# Patient Record
Sex: Female | Born: 1980 | Race: White | Hispanic: No | Marital: Married | Smoking: Never smoker
Health system: Southern US, Community
[De-identification: ages and names within clinical notes are randomized; demographics above are authoritative.]

## PROBLEM LIST (undated history)

## (undated) DIAGNOSIS — R011 Cardiac murmur, unspecified: Secondary | ICD-10-CM

## (undated) DIAGNOSIS — Z789 Other specified health status: Secondary | ICD-10-CM

## (undated) DIAGNOSIS — D649 Anemia, unspecified: Secondary | ICD-10-CM

## (undated) HISTORY — DX: Anemia, unspecified: D64.9

## (undated) HISTORY — PX: NO PAST SURGERIES: SHX2092

---

## 2010-09-20 LAB — GC/CHLAMYDIA PROBE AMP, GENITAL
Chlamydia: NEGATIVE
Gonorrhea: NEGATIVE

## 2010-12-22 ENCOUNTER — Other Ambulatory Visit (HOSPITAL_COMMUNITY): Payer: Self-pay | Admitting: Obstetrics and Gynecology

## 2010-12-22 DIAGNOSIS — O2690 Pregnancy related conditions, unspecified, unspecified trimester: Secondary | ICD-10-CM

## 2010-12-30 ENCOUNTER — Ambulatory Visit (HOSPITAL_COMMUNITY): Payer: Self-pay

## 2011-01-01 ENCOUNTER — Other Ambulatory Visit (HOSPITAL_COMMUNITY): Payer: Self-pay | Admitting: Obstetrics and Gynecology

## 2011-01-01 DIAGNOSIS — O2690 Pregnancy related conditions, unspecified, unspecified trimester: Secondary | ICD-10-CM

## 2011-01-01 DIAGNOSIS — Z3689 Encounter for other specified antenatal screening: Secondary | ICD-10-CM

## 2011-01-06 ENCOUNTER — Other Ambulatory Visit (HOSPITAL_COMMUNITY): Payer: Self-pay

## 2011-01-08 ENCOUNTER — Ambulatory Visit (HOSPITAL_COMMUNITY)
Admission: RE | Admit: 2011-01-08 | Discharge: 2011-01-08 | Disposition: A | Discharge status: Home / Self Care | Payer: Managed Care, Other (non HMO) | Source: Ambulatory Visit | Attending: Obstetrics and Gynecology | Admitting: Obstetrics and Gynecology

## 2011-01-08 DIAGNOSIS — Z3689 Encounter for other specified antenatal screening: Secondary | ICD-10-CM

## 2011-01-08 DIAGNOSIS — Z363 Encounter for antenatal screening for malformations: Secondary | ICD-10-CM | POA: Insufficient documentation

## 2011-01-08 DIAGNOSIS — O2690 Pregnancy related conditions, unspecified, unspecified trimester: Secondary | ICD-10-CM

## 2011-01-08 DIAGNOSIS — Z1389 Encounter for screening for other disorder: Secondary | ICD-10-CM | POA: Insufficient documentation

## 2011-01-08 DIAGNOSIS — O358XX0 Maternal care for other (suspected) fetal abnormality and damage, not applicable or unspecified: Secondary | ICD-10-CM | POA: Insufficient documentation

## 2011-02-24 ENCOUNTER — Inpatient Hospital Stay (HOSPITAL_COMMUNITY): Payer: Managed Care, Other (non HMO)

## 2011-02-24 ENCOUNTER — Encounter (HOSPITAL_COMMUNITY): Payer: Self-pay | Admitting: *Deleted

## 2011-02-24 ENCOUNTER — Inpatient Hospital Stay (HOSPITAL_COMMUNITY)
Admission: AD | Admit: 2011-02-24 | Discharge: 2011-03-08 | DRG: 765 | Disposition: A | Discharge status: Home / Self Care | Payer: Managed Care, Other (non HMO) | Source: Ambulatory Visit | Attending: Obstetrics and Gynecology | Admitting: Obstetrics and Gynecology

## 2011-02-24 DIAGNOSIS — O2690 Pregnancy related conditions, unspecified, unspecified trimester: Secondary | ICD-10-CM

## 2011-02-24 DIAGNOSIS — O4290 Premature rupture of membranes, unspecified as to length of time between rupture and onset of labor, unspecified weeks of gestation: Secondary | ICD-10-CM | POA: Diagnosis present

## 2011-02-24 DIAGNOSIS — O321XX0 Maternal care for breech presentation, not applicable or unspecified: Principal | ICD-10-CM | POA: Diagnosis present

## 2011-02-24 DIAGNOSIS — D649 Anemia, unspecified: Secondary | ICD-10-CM | POA: Diagnosis not present

## 2011-02-24 DIAGNOSIS — O283 Abnormal ultrasonic finding on antenatal screening of mother: Secondary | ICD-10-CM | POA: Diagnosis present

## 2011-02-24 DIAGNOSIS — N856 Intrauterine synechiae: Secondary | ICD-10-CM

## 2011-02-24 DIAGNOSIS — O26859 Spotting complicating pregnancy, unspecified trimester: Secondary | ICD-10-CM | POA: Diagnosis present

## 2011-02-24 DIAGNOSIS — O9903 Anemia complicating the puerperium: Secondary | ICD-10-CM | POA: Diagnosis not present

## 2011-02-24 HISTORY — DX: Other specified health status: Z78.9

## 2011-02-24 HISTORY — DX: Cardiac murmur, unspecified: R01.1

## 2011-02-24 LAB — CBC
Hemoglobin: 11.7 g/dL — ABNORMAL LOW (ref 12.0–15.0)
MCH: 33.6 pg (ref 26.0–34.0)
MCV: 98.6 fL (ref 78.0–100.0)
RBC: 3.48 MIL/uL — ABNORMAL LOW (ref 3.87–5.11)

## 2011-02-24 LAB — WET PREP, GENITAL
Clue Cells Wet Prep HPF POC: NONE SEEN
Trich, Wet Prep: NONE SEEN
WBC, Wet Prep HPF POC: NONE SEEN
Yeast Wet Prep HPF POC: NONE SEEN

## 2011-02-24 LAB — URINALYSIS, ROUTINE W REFLEX MICROSCOPIC
Bilirubin Urine: NEGATIVE
Glucose, UA: NEGATIVE mg/dL
Ketones, ur: NEGATIVE mg/dL
Leukocytes, UA: NEGATIVE
pH: 7.5 (ref 5.0–8.0)

## 2011-02-24 LAB — STREP B DNA PROBE: GBS: NEGATIVE

## 2011-02-24 MED ORDER — BETAMETHASONE SOD PHOS & ACET 6 (3-3) MG/ML IJ SUSP
12.0000 mg | INTRAMUSCULAR | Status: AC
  Administered 2011-02-24 – 2011-02-25 (×2): 12 mg via INTRAMUSCULAR
  Filled 2011-02-24 (×2): qty 2

## 2011-02-24 MED ORDER — DOCUSATE SODIUM 100 MG PO CAPS
100.0000 mg | ORAL_CAPSULE | Freq: Every day | ORAL | Status: DC
  Administered 2011-02-24 – 2011-03-05 (×10): 100 mg via ORAL
  Filled 2011-02-24 (×10): qty 1

## 2011-02-24 MED ORDER — SODIUM CHLORIDE 0.9 % IV SOLN
2.0000 g | Freq: Four times a day (QID) | INTRAVENOUS | Status: AC
  Administered 2011-02-24 – 2011-02-26 (×8): 2 g via INTRAVENOUS
  Filled 2011-02-24 (×9): qty 2000

## 2011-02-24 MED ORDER — AZITHROMYCIN 250 MG PO TABS
1000.0000 mg | ORAL_TABLET | Freq: Once | ORAL | Status: AC
  Administered 2011-02-24: 1000 mg via ORAL
  Filled 2011-02-24: qty 4

## 2011-02-24 MED ORDER — AZITHROMYCIN 500 MG PO TABS
1000.0000 mg | ORAL_TABLET | Freq: Once | ORAL | Status: AC
  Administered 2011-02-28: 1000 mg via ORAL
  Filled 2011-02-24: qty 2

## 2011-02-24 MED ORDER — PRENATAL MULTIVITAMIN CH
1.0000 | ORAL_TABLET | Freq: Every day | ORAL | Status: DC
  Administered 2011-02-24 – 2011-03-05 (×11): 1 via ORAL
  Filled 2011-02-24 (×10): qty 1

## 2011-02-24 MED ORDER — CALCIUM CARBONATE ANTACID 500 MG PO CHEW
2.0000 | CHEWABLE_TABLET | ORAL | Status: DC | PRN
  Administered 2011-02-25 – 2011-03-02 (×5): 400 mg via ORAL
  Filled 2011-02-24 (×5): qty 2

## 2011-02-24 MED ORDER — ZOLPIDEM TARTRATE 10 MG PO TABS: 10.0000 mg | ORAL_TABLET | Freq: Every evening | ORAL | Status: DC | PRN

## 2011-02-24 MED ORDER — LACTATED RINGERS IV SOLN
INTRAVENOUS | Status: DC
  Administered 2011-02-24: 1000 mL via INTRAVENOUS
  Administered 2011-02-24 – 2011-02-26 (×6): via INTRAVENOUS

## 2011-02-24 MED ORDER — ACETAMINOPHEN 325 MG PO TABS: 650.0000 mg | ORAL_TABLET | ORAL | Status: DC | PRN

## 2011-02-24 MED ORDER — AMOXICILLIN 500 MG PO CAPS
500.0000 mg | ORAL_CAPSULE | Freq: Three times a day (TID) | ORAL | Status: AC
  Administered 2011-02-26 – 2011-03-03 (×15): 500 mg via ORAL
  Filled 2011-02-24 (×15): qty 1

## 2011-02-24 NOTE — ED Notes (Signed)
Gevena Barre CNM in to see pt and discuss plan of care.

## 2011-02-24 NOTE — Progress Notes (Signed)
Pt returned from MFM.  Dr Pennie Rushing in room talking in length about plan of care.  All questions answered and pt and family has a good understanding.

## 2011-02-24 NOTE — H&P (Signed)
Aimee Lynch is a 31 y.o. female presenting for PPROM at 0200. She is [redacted]w[redacted]d with EDC of 04/16/11.  Denies any ctx or cramping, has had a persistent L sided back ache that has persisted w pregnancy, improves with tylenol and rest. Denies back pain now. Denies any VB, +FM.  Pregnancy significant for: 1. 2nd trimester VB 2. Heart murmor 3. 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 Pt began car at 10wks, at approx 13wks, pt had episode of bleeding and was tx'd for BV.  Anatomy scan at 18wks had incomplete views of feet. A repeat scan was done at 21wks and states was normal per [place] report. Detailed prenatal visit notes are unavailable at the time of this note. Pt then had a NOB visit at [facility] at 24.6wks.  Pt had an MFM consult secondary to uterine synechiae and bilateral club feet noted, MFM [place] otherwise normal, at 26wks. At 28wks pt had a wet prep that was normal. Pt had a fetal echo at 30wks that was normal, secondary to to pt heart murmor.  .Maternal Medical History:  Reason for admission: Reason for admission: rupture of membranes.  Contractions: denies  Fetal activity: Perceived fetal activity is normal.    Prenatal complications: no prenatal complications   OB History    Grav Para Term Preterm Abortions TAB SAB Ect Mult Living   1         0     Past Medical History  Diagnosis Date  . No pertinent past medical history   . Heart murmur    Past Surgical History  Procedure Date  . No past surgeries    Family History: family history is not on file. Heart dx - MA  Social History:  reports that she has never smoked. She does not have any smokeless tobacco history on file. She reports that she does not drink alcohol or use illicit drugs. Pt is MWF 30yo, works full-time, and has a bachelor's degree.   Review of Systems  All other systems reviewed and are negative.    Dilation: 1 Effacement (%): Thick Exam by::  S. Kamori Barbier CNM Blood pressure 111/48, pulse 82, temperature 98.1 F (36.7 C), temperature source Oral, resp. rate 18, height 5\' 2"  (1.575 m), weight 86.183 kg (190 lb). Maternal Exam:  Abdomen: Patient reports no abdominal tenderness. Fundal height is aga.   Fetal presentation: vertex  Introitus: Normal vulva. Vagina is negative for discharge.  Ferning test: not done.  Nitrazine test: not done. Amniotic fluid character: clear. Large amt pooling clear fluid amnisure pos  Pelvis: adequate for delivery.   Cervix: Cervix evaluated by sterile speculum exam and digital exam.     Fetal Exam Fetal Monitor Review: Mode: ultrasound.   Baseline rate: 140.  Variability: moderate (6-25 bpm).   Pattern: accelerations present and no decelerations.    Fetal State Assessment: Category I - tracings are normal.     Physical Exam  Nursing note and vitals reviewed. Constitutional: She is oriented to person, place, and time. She appears well-developed and well-nourished.  HENT:  Head: Normocephalic.  Neck: Normal range of motion.  Cardiovascular: Normal rate and normal heart sounds.   Respiratory: Effort normal and breath sounds normal.  GI: Soft.  Genitourinary: Vagina normal. No vaginal discharge found.       lg amt pooling clear fluid cx ft/th/high/vtx  Musculoskeletal: Normal range of motion. She exhibits  no edema.  Neurological: She is alert and oriented to person, place, and time.  Skin: Skin is warm and dry.  Psychiatric: She has a normal mood and affect. Her behavior is normal.    Prenatal labs: ABO, Rh: A/Positive/-- (09/01 0000) Antibody: Negative (09/01 0000) Rubella: Immune (09/01 0000) RPR: Nonreactive (09/01 0000)  HBsAg: Negative (09/01 0000)  HIV: Non-reactive (09/01 0000)  GBS: Unknown (02/05 0000) done on admission on 02/24/11 GC/CT, pap, urine cx neg at NOB Varicella imm Quad neg 1hr gtt results unk at time of admission, but per pt were  "normal"   Assessment/Plan: IUP at [redacted]w[redacted]d  PPROM no evidence of labor  FHR reassuring  Admit to Antepartum IVF's LR IV ampicillin x48hr then PO amoxicillin/zithromax MFM consult with [place] to follow NICU consult BMZ course x2 doses q24 Wet prep  GC/CT GBS UA cx     Jeter Tomey M 02/24/2011, 8:58 AM

## 2011-02-24 NOTE — Consult Note (Signed)
MATERNAL FETAL MEDICINE CONSULT  Patient Name: Aimee Lynch Medical Record Number:  [id number] Date of Birth: [birth year] Requesting Physician Name:  Esmeralda Arthur, MD Date of Service: 02/24/2011  Chief Complaint PPROM  History of Present Illness Aimee Lynch was seen today for prenatal diagnosis secondary to PPROM at the request of Esmeralda Arthur, MD.  The patient is a 31 y.o. G1P0,at [redacted]w[redacted]d with an EDD of 04/16/2011.  She experienced a large gush of fluid this morning at 2am, followed by another large gush after arriving at the hospital for evaluation.  She denies any fever, chills, abdominal pain, contractions, or vaginal bleeding.  Her fetus is moving normally.  Her pregnancy has been complicated by a fetus with bilateral clubbed feet and uterine synechiae.  Review of Systems Pertinent items are noted in HPI.  Patient History OB History    Grav Para Term Preterm Abortions TAB SAB Ect Mult Living   1         0     # Outc Date GA Lbr Len/2nd Wgt Sex Del Anes PTL Lv   1 CUR               Past Medical History  Diagnosis Date  . No pertinent past medical history   . Heart murmur     Past Surgical History  Procedure Date  . No past surgeries     History   Social History  . Marital Status: Married    Spouse Name: N/A    Number of Children: N/A  . Years of Education: N/A   Social History Main Topics  . Smoking status: Never Smoker   . Smokeless tobacco: None  . Alcohol Use: No  . Drug Use: No  . Sexually Active: Yes   Other Topics Concern  . None   Social History Narrative  . None   In addition, the patient has no family history of mental retardation, birth defects, or genetic diseases.  Physical Examination Filed Vitals:   02/24/11 1112 02/24/11 1117 02/24/11 1122 02/24/11 1127  BP:      Pulse: 91 109 112 91  Temp:      TempSrc:      Resp:      Height:      Weight:      SpO2: 98% 97% 99% 99%   General appearance - alert, well appearing, and in no  distress Abdomen - soft, nontender, nondistended, no masses or organomegaly  Assessment and Recommendations 1.  PPROM.  The patient has clear evidence of PPROM on physical exam, but is showing no signs of labor, infection, or abruption at this time.  As such she should be managed in the standard fashion for PPROM and be delivered at 34 weeks, or earlier if signs of infection, labor, abruption, or fetal distress appear.  She is currently receiving betamethasone and PPROM latency antibiotics.  No modifications in management are needed at this time.  Presently her baby is in the frank breech presentation, so she will require a cesarean delivery unless the fetus spontaneous changes to a cephalic presentation prior to delivery.  Rema Fendt, MD

## 2011-02-24 NOTE — Progress Notes (Signed)
Rolled over in bed after voiding and lost a lot of watery discharge.  Stood up and it came running down her legs.  No cramping or bleeding.

## 2011-02-24 NOTE — Progress Notes (Signed)
UR Chart review completed.  

## 2011-02-24 NOTE — Progress Notes (Signed)
Clemmie Buelna is a 31 y.o. G1P0 at [redacted]w[redacted]d by LMP admitted for PROM  Subjective: GI: negative GU: neg OB: Good fetal movement        Objective: BP 105/53  Pulse 91  Temp(Src) 97.7 F (36.5 C) (Oral)  Resp 20  Ht 5\' 2"  (1.575 m)  Wt 190 lb (86.183 kg)  BMI 34.75 kg/m2  SpO2 99%      FHT:  FHR: 120-140 bpm, variability: moderate,  accelerations:  Present,  decelerations:  Present at admission, but resolved over first few hours, now no significant decelerations UC:   irreg and rare SVE:   Dilation: 1 Effacement (%): Thick Exam by:: S. Lillard CNM  Labs: Lab Results  Component Value Date   WBC 13.9* 02/24/2011   HGB 11.7* 02/24/2011   HCT 34.3* 02/24/2011   MCV 98.6 02/24/2011   PLT 272 02/24/2011    Assessment / Plan: GC/Cl on dirty catch urine Urine c&S Continue antibiotics Tocolysis until 24 hours after second dose of Bmethasone Magnesium for neuroprophylaxis if labor begins prior to 34 weeks MFM consult results pending  Fetal Wellbeing:  Category II   Aunika Kirsten P 02/24/2011, 12:41 PM

## 2011-02-24 NOTE — Consult Note (Signed)
Antenatal consult: pt inquiring into potential lactation difficulties with a preterm baby.  Questions about expressing milk after birth, issues with latch, etc. answered. Lurline Hare [place]

## 2011-02-24 NOTE — Progress Notes (Signed)
To MFM via w/c.

## 2011-02-25 LAB — URINE CULTURE

## 2011-02-25 LAB — GC/CHLAMYDIA PROBE AMP, URINE
Chlamydia, Swab/Urine, PCR: NEGATIVE
GC Probe Amp, Urine: NEGATIVE

## 2011-02-25 LAB — GC/CHLAMYDIA PROBE AMP, GENITAL: Chlamydia, DNA Probe: NEGATIVE

## 2011-02-25 MED ORDER — NIFEDIPINE 10 MG PO CAPS
10.0000 mg | ORAL_CAPSULE | ORAL | Status: DC | PRN
  Administered 2011-03-05: 10 mg via ORAL
  Filled 2011-02-25 (×2): qty 1

## 2011-02-25 NOTE — Progress Notes (Signed)
Patient ID: Aimee Lynch, female   DOB: [birth year], 31 y.o.   MRN: [id number] Pt without complaints.  No leakage of fluid or VB.  Good FM  BP 121/68  Pulse 99  Temp(Src) 98.8 F (37.1 C) (Oral)  Resp 18  Ht 5\' 2"  (1.575 m)  Wt 86.183 kg (190 lb)  BMI 34.75 kg/m2  SpO2 99%  FHTS Baseline: 120-125 bpm, Variability: Fair (1-6 bpm), Accelerations: Reactive and Decelerations: Absent  Toco irregular, every 2-20 minutes  Pt in NAD CV RRR Lungs CTAB abd  Gravid soft and NT GU no vb EXt no calf tenderness Results for orders placed during the hospital encounter of 02/24/11 (from the past 72 hour(s))  STREP B DNA PROBE     Status: Normal      Component Value Range Comment   Group B Strep Ag Unknown     AMNISURE RUPTURE OF MEMBRANE (ROM)     Status: Normal   Collection Time   02/24/11  3:44 AM      Component Value Range Comment   Amnisure ROM POSITIVE     GC/CHLAMYDIA PROBE AMP, GENITAL     Status: Normal   Collection Time   02/24/11  4:20 AM      Component Value Range Comment   GC Probe Amp, Genital NEGATIVE  NEGATIVE     Chlamydia, DNA Probe NEGATIVE  NEGATIVE    WET PREP, GENITAL     Status: Normal   Collection Time   02/24/11  4:20 AM      Component Value Range Comment   Yeast Wet Prep HPF POC NONE SEEN  NONE SEEN     Trich, Wet Prep NONE SEEN  NONE SEEN     Clue Cells Wet Prep HPF POC NONE SEEN  NONE SEEN     WBC, Wet Prep HPF POC NONE SEEN  NONE SEEN    CULTURE, BETA STREP (GROUP B ONLY)     Status: Normal (Preliminary result)   Collection Time   02/24/11  4:20 AM      Component Value Range Comment   Specimen Description VAGINAL/RECTAL      Special Requests NONE      Culture Culture reincubated for better growth      Report Status PENDING     RPR     Status: Normal   Collection Time   02/24/11  5:10 AM      Component Value Range Comment   RPR NON REACTIVE  NON REACTIVE    CBC     Status: Abnormal   Collection Time   02/24/11  5:10 AM      Component Value Range Comment   WBC  13.9 (*) 4.0 - 10.5 (K/uL)    RBC 3.48 (*) 3.87 - 5.11 (MIL/uL)    Hemoglobin 11.7 (*) 12.0 - 15.0 (g/dL)    HCT 40.9 (*) 81.1 - 46.0 (%)    MCV 98.6  78.0 - 100.0 (fL)    MCH 33.6  26.0 - 34.0 (pg)    MCHC 34.1  30.0 - 36.0 (g/dL)    RDW 91.4  78.2 - 95.6 (%)    Platelets 272  150 - 400 (K/uL)   URINALYSIS, ROUTINE W REFLEX MICROSCOPIC     Status: Normal   Collection Time   02/24/11  6:10 AM      Component Value Range Comment   Color, Urine YELLOW  YELLOW     APPearance CLEAR  CLEAR     Specific Gravity, Urine  1.010  1.005 - 1.030     pH 7.5  5.0 - 8.0     Glucose, UA NEGATIVE  NEGATIVE (mg/dL)    Hgb urine dipstick NEGATIVE  NEGATIVE     Bilirubin Urine NEGATIVE  NEGATIVE     Ketones, ur NEGATIVE  NEGATIVE (mg/dL)    Protein, ur NEGATIVE  NEGATIVE (mg/dL)    Urobilinogen, UA 0.2  0.0 - 1.0 (mg/dL)    Nitrite NEGATIVE  NEGATIVE     Leukocytes, UA NEGATIVE  NEGATIVE  MICROSCOPIC NOT DONE ON URINES WITH NEGATIVE PROTEIN, BLOOD, LEUKOCYTES, NITRITE, OR GLUCOSE <1000 mg/dL.  GC/CHLAMYDIA PROBE AMP, URINE     Status: Normal   Collection Time   02/24/11 12:55 PM      Component Value Range Comment   GC Probe Amp, Urine NEGATIVE  NEGATIVE     Chlamydia, Swab/Urine, PCR NEGATIVE  NEGATIVE      Assessment and Plan [redacted]w[redacted]d  PPROM Pt s/p betamethasone Plan delivery if labor occurs or at 34 weeks Plan cesarean because infant is breech

## 2011-02-25 NOTE — Consult Note (Signed)
Asked by Dr. Pennie Rushing to speak with Aimee Lynch to discuss preterm outcome. Chart reviewed.  32 5/7 weeks, SROM on 2/5 at 0200. Not in labor at the moment. Prenatal labs are neg with unknown GBS. She has received 2 doses of Betamethasone, currently on antibiotics. FUS is significant for mild club feet, EFW 1827 gms, breech.  I spoke to both Mr.  and [name] in the patient's rm.  As she is not in labor, I discussed general outcome of 34 wk preterm infants per target. Discussed need for NICU admission for evaluation of infection due to PPROM and level 2 care (temp support, gavage feeding)  I stated that a small percentage of infants born at this age will still have lung disease. Resuscitation, respiratory support, and LOS were discussed.  Discussed breastfeeding. Questions answered.  Thank you for this consult.  Face to face time: 20 min.  Desiree Daise Q

## 2011-02-26 NOTE — Progress Notes (Signed)
31 y.o. year old female,at [redacted]w[redacted]d gestation.  SUBJECTIVE:  The patient is doing well this morning. She denies contractions.  OBJECTIVE:  BP 98/61  Pulse 79  Temp(Src) 98.2 F (36.8 C) (Oral)  Resp 18  Ht 5\' 2"  (1.575 m)  Wt 84.006 kg (185 lb 3.2 oz)  BMI 33.87 kg/m2  SpO2 99%  Fetal Heart Tones:  Stable  Contractions:          Few  Chest: Clear  Heart: Regular rate and rhythm  Abdomen: Nontender, gravid  Extremities: No masses, nontender  ASSESSMENT:  [redacted]w[redacted]d Weeks Pregnancy  Preterm rupture membranes  No labor  PLAN:  We will continue in hospital observation.  We will Hep-Lock her IV.  We will monitor the patient 3 times each day.  Cesarean section was discussed. The infant is currently in a breech presentation.  Leonard Schwartz, M.D.

## 2011-02-27 NOTE — Progress Notes (Signed)
Patient ID: Aimee Lynch, female   DOB: [birth year], 31 y.o.   MRN: [id number] Pt without complaints.   leakage ofclear fluid . no VB.  Good FM  BP 135/71  Pulse 87  Temp(Src) 98.4 F (36.9 C) (Oral)  Resp 18  Ht 5\' 2"  (1.575 m)  Wt 84.006 kg (185 lb 3.2 oz)  BMI 33.87 kg/m2  SpO2 99%  FHTS Baseline: 140 bpm, Variability: Good {> 6 bpm), Accelerations: Reactive and Decelerations: Variable: moderate  Toco none  Pt in NAD CV RRR Lungs CTAB abd  Gravid soft and NT GU no vb EXt no calf tenderness No results found for this or any previous visit (from the past 72 hour(s)).  Assessment and Plan [redacted]w[redacted]d  PPROM Breech Plan Cesarean at 34 weeks Monitor closely

## 2011-02-27 NOTE — Progress Notes (Signed)
UR Chart review completed.  

## 2011-02-28 MED ORDER — SODIUM CHLORIDE 0.9 % IJ SOLN
3.0000 mL | Freq: Two times a day (BID) | INTRAMUSCULAR | Status: DC
  Administered 2011-02-28 – 2011-03-05 (×9): 3 mL via INTRAVENOUS

## 2011-02-28 NOTE — Progress Notes (Signed)
Patient ID: Aimee Lynch, female   DOB: [birth year], 31 y.o.   MRN: [id number] Pt without complaints. leakage of clear fluid or VB.  Good FM  BP 137/86  Pulse 103  Temp(Src) 98.9 F (37.2 C) (Oral)  Resp 18  Ht 5\' 2"  (1.575 m)  Wt 84.006 kg (185 lb 3.2 oz)  BMI 33.87 kg/m2  SpO2 99%  FHTS Baseline: 130 bpm, Variability: Good {> 6 bpm), Accelerations: Reactive and Decelerations: Absent  Toco none  Pt in NAD CV RRR Lungs CTAB abd  Gravid soft and NT GU no vb EXt no calf tenderness No results found for this or any previous visit (from the past 72 hour(s)).  Assessment and Plan [redacted]w[redacted]d  PPRom no signs of chorioamnionitis.   Continue current care

## 2011-03-01 MED ORDER — SIMETHICONE 80 MG PO CHEW: 80.0000 mg | CHEWABLE_TABLET | Freq: Four times a day (QID) | ORAL | Status: DC | PRN

## 2011-03-01 NOTE — Progress Notes (Signed)
Patient ID: Aimee Lynch, female   DOB: 1980-11-04, 31 y.o.   MRN: [id number] Pt without complaints.  some leakage of fluid .  no VB.  Good FM  BP 116/62  Pulse 92  Temp(Src) 99.4 F (37.4 C) (Oral)  Resp 20  Ht 5\' 2"  (1.575 m)  Wt 84.006 kg (185 lb 3.2 oz)  BMI 33.87 kg/m2  SpO2 99%  FHTS Baseline: 130 bpm, Variability: Good {> 6 bpm), Accelerations: Reactive and Decelerations: Absent  Toco none  Pt in NAD CV RRR Lungs CTAB abd  Gravid soft and NT GU no vb EXt no calf tenderness No results found for this or any previous visit (from the past 72 hour(s)).  Assessment and Plan [redacted]w[redacted]d  PPROM Will monitor temp curve.  It is currently rising if she develops abdominal pain or a temp greater than or equal to 100.4 I recommend delivery

## 2011-03-02 NOTE — Progress Notes (Signed)
UR chart review completed.  

## 2011-03-02 NOTE — Progress Notes (Signed)
RN called to room and pt showing Martin Nation RN bright red VB in the toilet and on peripad.

## 2011-03-02 NOTE — Progress Notes (Addendum)
Hospital day # 6 pregnancy at [redacted]w[redacted]d with PROM 02/24/11  S: Doing well, reports good fetal activity      Contractions: None      Vaginal bleeding:None       Vaginal discharge: Occasional leaking of clear fluid  O: BP 124/66  Pulse 79  Temp(Src) 98.1 F (36.7 C) (Oral)  Resp 20  Ht 5\' 2"  (1.575 m)  Wt 84.006 kg (185 lb 3.2 oz)  BMI 33.87 kg/m2  SpO2 99%     Chest clear      Heart RRR without murmur      Fetal tracings:  Reactive on q shift NST      Uterus   Non-tender      Extremities: no significant edema and no signs of DVT  A: [redacted]w[redacted]d with PROM 2/5     Stable     No evidence chorioamnionitis or labor  P:  Continue current plan of care      Anticipate C/S for delivery, due to breech presentation on last [place] 02/24/11  Nigel Bridgeman CNM, [place] 03/02/2011 9:21 AM    Agree with above - AYR

## 2011-03-02 NOTE — Progress Notes (Signed)
33 4/[redacted]  weeks gestation, with PROM.  Height  62" Weight 185 Lbs pre-pregnancy weight 145 Lbs.Pre-pregnancy  BMI 26.5  IBW 110 Lbs  Total weight gain 40 Lbs. Weight gain goals 15-25 Lbs.   Estimated needs: 16-1800 kcal/day, 60-70 grams protein/day, 1.8 liters fluid/day Regular diet tolerated well, appetite good. Current diet prescription will provide for increased needs. No abnormal nutrition related labs  Nutrition Dx: Increased nutrient needs r/t pregnancy and fetal growth requirements aeb [redacted] weeks gestation.  No educational needs assessed at this time.

## 2011-03-03 NOTE — Progress Notes (Signed)
CNM notified of variables, pt to stay on monitor til 1900 and then reassess.

## 2011-03-03 NOTE — Progress Notes (Signed)
PROGRESS NOTE  I have reviewed the patient's vital signs, labs, and notes. I have examined the patient. I agree with the previous note from the Certified Nurse Midwife.  Aimee Lynch, M.D.  

## 2011-03-03 NOTE — Progress Notes (Signed)
Pt up to shower

## 2011-03-03 NOTE — Progress Notes (Signed)
Hospital day # 7 pregnancy at [redacted]w[redacted]d--PROM 02/24/11  S: Doing well, reports good fetal activity.  No further bleeding since last night.  Monitored through night due to small amount BRB last evening.      Contractions:  None per patient      Vaginal bleeding: None now (since late evening)       Vaginal discharge: None at present  O: BP 102/47  Pulse 89  Temp(Src) 98.3 F (36.8 C) (Oral)  Resp 20  Ht 5\' 2"  (1.575 m)  Wt 84.006 kg (185 lb 3.2 oz)  BMI 33.87 kg/m2  SpO2 99%      Fetal tracings:  Reactive through night, occasional mild variables.      Uterus non-tender, occasional mild irritability through night      Extremities: no significant edema and no signs of DVT  A: [redacted]w[redacted]d with PROM since 02/24/11     3rd trimester spotting     Stable  P: Continue current plan of care     May return to q shift monitoring--if bleeding recurrs, will re-     institute continuous monitoring.     [place] scheduled 03/05/11, with current plan for C/S on 2/15 (due to breech)  Nigel Bridgeman , CNM, [place] 03/03/2011 9:09 AM

## 2011-03-04 ENCOUNTER — Other Ambulatory Visit: Payer: Self-pay | Admitting: Obstetrics and Gynecology

## 2011-03-04 NOTE — Progress Notes (Addendum)
Aimee Lynch is a 31 y.o. G1P0 at [redacted]w[redacted]d by :PPROM  Subjective: comfortable has had backache with some contractions but less contractions last hour, some pink discharge, baby is active, no constipation   Objective: BP 119/63  Pulse 90  Temp(Src) 98.5 F (36.9 C) (Oral)  Resp 18  Ht 5\' 2"  (1.575 m)  Wt 185 lb 3.2 oz (84.006 kg)  BMI 33.87 kg/m2  SpO2 99% I/O last 3 completed shifts: In: 6 [I.V.:6] Out: -     FHT:  FHR: 140 bpm, variability: moderate,  accelerations:  Present,  decelerations:  Absent UC:  rare SVE:   Dilation: 1 Effacement (%): Thick Exam by:: S. Lillard CNM Alert, cooperative, talkative, Lungs clear bilaterally, ap regular, bowel sounds active, abd soft,gravid, nt Trace edema lower legs with SCDs, -Homan's sign bilaterally Labs: Lab Results  Component Value Date   WBC 13.9* 02/24/2011   HGB 11.7* 02/24/2011   HCT 34.3* 02/24/2011   MCV 98.6 02/24/2011   PLT 272 02/24/2011    Assessment / Plan: 33 67 week IUP PPROM breech Plan ultrasound tomorrow for presentation, C/S Fri if breech, continue care.  Aimee Lynch 03/04/2011, 10:17 AM  I have reviewed the patient's labs and vital signs as well as the fetal heart rate tracing.  She concurs with the recommendation for Cesarean section on 03-06-11 if fetus is breech on tomorrow's [place], and delivery by induction if fetus is vertex.  The indications, risks and benefits of the recommended course of treatment has been reviewed with the patient and her husband.  Their questions were answered and they wish to proceed.

## 2011-03-04 NOTE — Progress Notes (Signed)
UR chart review completed.  

## 2011-03-05 ENCOUNTER — Other Ambulatory Visit: Payer: Self-pay | Admitting: Obstetrics and Gynecology

## 2011-03-05 ENCOUNTER — Encounter (HOSPITAL_COMMUNITY): Payer: Self-pay | Admitting: Anesthesiology

## 2011-03-05 ENCOUNTER — Inpatient Hospital Stay (HOSPITAL_COMMUNITY): Payer: Managed Care, Other (non HMO) | Admitting: Anesthesiology

## 2011-03-05 ENCOUNTER — Encounter (HOSPITAL_COMMUNITY): Payer: Self-pay | Admitting: *Deleted

## 2011-03-05 ENCOUNTER — Ambulatory Visit (HOSPITAL_COMMUNITY)
Admission: AD | Disposition: A | Discharge status: Home / Self Care | Payer: Managed Care, Other (non HMO) | Source: Ambulatory Visit | Attending: Obstetrics and Gynecology

## 2011-03-05 ENCOUNTER — Inpatient Hospital Stay (HOSPITAL_COMMUNITY): Payer: Managed Care, Other (non HMO)

## 2011-03-05 LAB — CBC
HCT: 35.9 % — ABNORMAL LOW (ref 36.0–46.0)
Hemoglobin: 11.9 g/dL — ABNORMAL LOW (ref 12.0–15.0)
RDW: 13.7 % (ref 11.5–15.5)
WBC: 20.1 10*3/uL — ABNORMAL HIGH (ref 4.0–10.5)

## 2011-03-05 SURGERY — Surgical Case
Anesthesia: Spinal | Wound class: Clean Contaminated

## 2011-03-05 MED ORDER — ONDANSETRON HCL 4 MG PO TABS: 4.0000 mg | ORAL_TABLET | ORAL | Status: DC | PRN

## 2011-03-05 MED ORDER — DIPHENHYDRAMINE HCL 50 MG/ML IJ SOLN
INTRAMUSCULAR | Status: AC
  Filled 2011-03-05: qty 1

## 2011-03-05 MED ORDER — OXYTOCIN 10 UNIT/ML IJ SOLN: 20.0000 [IU] | INTRAVENOUS | Status: DC | PRN

## 2011-03-05 MED ORDER — SCOPOLAMINE 1 MG/3DAYS TD PT72
MEDICATED_PATCH | TRANSDERMAL | Status: AC
  Filled 2011-03-05: qty 1

## 2011-03-05 MED ORDER — NALOXONE HCL 0.4 MG/ML IJ SOLN: 1.0000 ug/kg/h | INTRAMUSCULAR | Status: DC | PRN

## 2011-03-05 MED ORDER — CEFAZOLIN SODIUM-DEXTROSE 2-3 GM-% IV SOLR
2.0000 g | Freq: Once | INTRAVENOUS | Status: AC
  Administered 2011-03-05: 2 g via INTRAVENOUS
  Filled 2011-03-05: qty 50

## 2011-03-05 MED ORDER — BUPIVACAINE IN DEXTROSE 0.75-8.25 % IT SOLN
INTRATHECAL | Status: DC | PRN
  Administered 2011-03-05: 1.4 mL via INTRATHECAL

## 2011-03-05 MED ORDER — DIBUCAINE 1 % RE OINT: 1.0000 "application " | TOPICAL_OINTMENT | RECTAL | Status: DC | PRN

## 2011-03-05 MED ORDER — SENNOSIDES-DOCUSATE SODIUM 8.6-50 MG PO TABS
2.0000 | ORAL_TABLET | Freq: Every day | ORAL | Status: DC
  Administered 2011-03-05 – 2011-03-07 (×3): 2 via ORAL

## 2011-03-05 MED ORDER — FENTANYL CITRATE 0.05 MG/ML IJ SOLN
INTRAMUSCULAR | Status: DC | PRN
  Administered 2011-03-05: 25 ug via INTRATHECAL

## 2011-03-05 MED ORDER — FENTANYL CITRATE 0.05 MG/ML IJ SOLN
INTRAMUSCULAR | Status: AC
Start: 2011-03-05 — End: 2011-03-05
  Filled 2011-03-05: qty 2

## 2011-03-05 MED ORDER — CITRIC ACID-SODIUM CITRATE 334-500 MG/5ML PO SOLN
ORAL | Status: AC
  Administered 2011-03-05: 30 mL
  Filled 2011-03-05: qty 15

## 2011-03-05 MED ORDER — MENTHOL 3 MG MT LOZG: 1.0000 | LOZENGE | OROMUCOSAL | Status: DC | PRN

## 2011-03-05 MED ORDER — KETOROLAC TROMETHAMINE 30 MG/ML IJ SOLN: 30.0000 mg | Freq: Four times a day (QID) | INTRAMUSCULAR | Status: AC | PRN

## 2011-03-05 MED ORDER — ONDANSETRON HCL 4 MG/2ML IJ SOLN: 4.0000 mg | INTRAMUSCULAR | Status: DC | PRN

## 2011-03-05 MED ORDER — NALOXONE HCL 0.4 MG/ML IJ SOLN: 0.4000 mg | INTRAMUSCULAR | Status: DC | PRN

## 2011-03-05 MED ORDER — ONDANSETRON HCL 4 MG/2ML IJ SOLN
INTRAMUSCULAR | Status: DC | PRN
  Administered 2011-03-05: 4 mg via INTRAVENOUS

## 2011-03-05 MED ORDER — CEFAZOLIN SODIUM 1-5 GM-% IV SOLN
INTRAVENOUS | Status: DC | PRN
  Administered 2011-03-05: 2 g via INTRAVENOUS

## 2011-03-05 MED ORDER — DIPHENHYDRAMINE HCL 50 MG/ML IJ SOLN
12.5000 mg | INTRAMUSCULAR | Status: DC | PRN
  Administered 2011-03-05: 12.5 mg via INTRAVENOUS

## 2011-03-05 MED ORDER — MEPERIDINE HCL 25 MG/ML IJ SOLN: 6.2500 mg | INTRAMUSCULAR | Status: DC | PRN

## 2011-03-05 MED ORDER — KETOROLAC TROMETHAMINE 30 MG/ML IJ SOLN
30.0000 mg | Freq: Four times a day (QID) | INTRAMUSCULAR | Status: AC | PRN
  Administered 2011-03-05: 30 mg via INTRAVENOUS

## 2011-03-05 MED ORDER — NALBUPHINE HCL 10 MG/ML IJ SOLN: 5.0000 mg | INTRAMUSCULAR | Status: DC | PRN

## 2011-03-05 MED ORDER — TETANUS-DIPHTH-ACELL PERTUSSIS 5-2.5-18.5 LF-MCG/0.5 IM SUSP
0.5000 mL | Freq: Once | INTRAMUSCULAR | Status: DC
  Filled 2011-03-05: qty 0.5

## 2011-03-05 MED ORDER — OXYTOCIN 10 UNIT/ML IJ SOLN
INTRAMUSCULAR | Status: DC | PRN
  Administered 2011-03-05 (×2): 20 [IU]

## 2011-03-05 MED ORDER — IBUPROFEN 600 MG PO TABS: 600.0000 mg | ORAL_TABLET | Freq: Four times a day (QID) | ORAL | Status: DC | PRN

## 2011-03-05 MED ORDER — OXYTOCIN 10 UNIT/ML IJ SOLN
INTRAMUSCULAR | Status: AC
  Filled 2011-03-05: qty 2

## 2011-03-05 MED ORDER — WITCH HAZEL-GLYCERIN EX PADS: 1.0000 "application " | MEDICATED_PAD | CUTANEOUS | Status: DC | PRN

## 2011-03-05 MED ORDER — MORPHINE SULFATE (PF) 0.5 MG/ML IJ SOLN
INTRAMUSCULAR | Status: DC | PRN
  Administered 2011-03-05: 150 ug via INTRATHECAL

## 2011-03-05 MED ORDER — MORPHINE SULFATE 0.5 MG/ML IJ SOLN
INTRAMUSCULAR | Status: AC
  Filled 2011-03-05: qty 10

## 2011-03-05 MED ORDER — OXYCODONE-ACETAMINOPHEN 5-325 MG PO TABS: 1.0000 | ORAL_TABLET | ORAL | Status: DC | PRN

## 2011-03-05 MED ORDER — FENTANYL CITRATE 0.05 MG/ML IJ SOLN
INTRAMUSCULAR | Status: AC
  Filled 2011-03-05: qty 2

## 2011-03-05 MED ORDER — IBUPROFEN 600 MG PO TABS
600.0000 mg | ORAL_TABLET | Freq: Four times a day (QID) | ORAL | Status: DC
  Administered 2011-03-06 – 2011-03-08 (×11): 600 mg via ORAL
  Filled 2011-03-05 (×11): qty 1

## 2011-03-05 MED ORDER — SODIUM CHLORIDE 0.9 % IJ SOLN: 3.0000 mL | INTRAMUSCULAR | Status: DC | PRN

## 2011-03-05 MED ORDER — PHENYLEPHRINE 40 MCG/ML (10ML) SYRINGE FOR IV PUSH (FOR BLOOD PRESSURE SUPPORT)
PREFILLED_SYRINGE | INTRAVENOUS | Status: AC
  Filled 2011-03-05: qty 5

## 2011-03-05 MED ORDER — METOCLOPRAMIDE HCL 5 MG/ML IJ SOLN: 10.0000 mg | Freq: Three times a day (TID) | INTRAMUSCULAR | Status: DC | PRN

## 2011-03-05 MED ORDER — LANOLIN HYDROUS EX OINT: 1.0000 "application " | TOPICAL_OINTMENT | CUTANEOUS | Status: DC | PRN

## 2011-03-05 MED ORDER — DIPHENHYDRAMINE HCL 50 MG/ML IJ SOLN: 25.0000 mg | INTRAMUSCULAR | Status: DC | PRN

## 2011-03-05 MED ORDER — 0.9 % SODIUM CHLORIDE (POUR BTL) OPTIME
TOPICAL | Status: DC | PRN
  Administered 2011-03-05: 2000 mL

## 2011-03-05 MED ORDER — SIMETHICONE 80 MG PO CHEW: 80.0000 mg | CHEWABLE_TABLET | ORAL | Status: DC | PRN

## 2011-03-05 MED ORDER — DIPHENHYDRAMINE HCL 25 MG PO CAPS
25.0000 mg | ORAL_CAPSULE | ORAL | Status: DC | PRN
  Filled 2011-03-05: qty 1

## 2011-03-05 MED ORDER — LACTATED RINGERS IV SOLN
INTRAVENOUS | Status: DC
  Administered 2011-03-05 – 2011-03-06 (×2): via INTRAVENOUS

## 2011-03-05 MED ORDER — SIMETHICONE 80 MG PO CHEW
80.0000 mg | CHEWABLE_TABLET | Freq: Three times a day (TID) | ORAL | Status: DC
  Administered 2011-03-05 – 2011-03-08 (×6): 80 mg via ORAL

## 2011-03-05 MED ORDER — PRENATAL MULTIVITAMIN CH
1.0000 | ORAL_TABLET | Freq: Every day | ORAL | Status: DC
  Administered 2011-03-07 – 2011-03-08 (×2): 1 via ORAL
  Filled 2011-03-05 (×2): qty 1

## 2011-03-05 MED ORDER — PHENYLEPHRINE HCL 10 MG/ML IJ SOLN
INTRAMUSCULAR | Status: DC | PRN
  Administered 2011-03-05: 80 ug via INTRAVENOUS

## 2011-03-05 MED ORDER — SCOPOLAMINE 1 MG/3DAYS TD PT72
1.0000 | MEDICATED_PATCH | Freq: Once | TRANSDERMAL | Status: AC
  Administered 2011-03-05: 1.5 mg via TRANSDERMAL

## 2011-03-05 MED ORDER — OXYTOCIN 20 UNITS IN LACTATED RINGERS INFUSION - SIMPLE
125.0000 mL/h | INTRAVENOUS | Status: AC
  Filled 2011-03-05: qty 1000

## 2011-03-05 MED ORDER — LACTATED RINGERS IV SOLN
INTRAVENOUS | Status: DC | PRN
  Administered 2011-03-05 (×4): via INTRAVENOUS

## 2011-03-05 MED ORDER — ZOLPIDEM TARTRATE 5 MG PO TABS: 5.0000 mg | ORAL_TABLET | Freq: Every evening | ORAL | Status: DC | PRN

## 2011-03-05 MED ORDER — FENTANYL CITRATE 0.05 MG/ML IJ SOLN
25.0000 ug | INTRAMUSCULAR | Status: DC | PRN
  Administered 2011-03-05 (×2): 50 ug via INTRAVENOUS

## 2011-03-05 MED ORDER — DIPHENHYDRAMINE HCL 25 MG PO CAPS: 25.0000 mg | ORAL_CAPSULE | Freq: Four times a day (QID) | ORAL | Status: DC | PRN

## 2011-03-05 MED ORDER — KETOROLAC TROMETHAMINE 30 MG/ML IJ SOLN
INTRAMUSCULAR | Status: AC
  Filled 2011-03-05: qty 1

## 2011-03-05 MED ORDER — ONDANSETRON HCL 4 MG/2ML IJ SOLN: 4.0000 mg | Freq: Three times a day (TID) | INTRAMUSCULAR | Status: DC | PRN

## 2011-03-05 MED ORDER — CEFAZOLIN SODIUM-DEXTROSE 2-3 GM-% IV SOLR: 2.0000 g | INTRAVENOUS | Status: DC

## 2011-03-05 SURGICAL SUPPLY — 39 items
BENZOIN TINCTURE PRP APPL 2/3 (GAUZE/BANDAGES/DRESSINGS) ×2 IMPLANT
CHLORAPREP W/TINT 26ML (MISCELLANEOUS) ×2 IMPLANT
CLOSURE STERI STRIP 1/2 X4 (GAUZE/BANDAGES/DRESSINGS) ×2 IMPLANT
CLOTH BEACON ORANGE TIMEOUT ST (SAFETY) ×2 IMPLANT
CONTAINER PREFILL 10% NBF 15ML (MISCELLANEOUS) IMPLANT
DRESSING TELFA 8X3 (GAUZE/BANDAGES/DRESSINGS) ×2 IMPLANT
DRSG PAD ABDOMINAL 8X10 ST (GAUZE/BANDAGES/DRESSINGS) ×2 IMPLANT
ELECT REM PT RETURN 9FT ADLT (ELECTROSURGICAL) ×2
ELECTRODE REM PT RTRN 9FT ADLT (ELECTROSURGICAL) ×1 IMPLANT
EXTRACTOR VACUUM M CUP 4 TUBE (SUCTIONS) IMPLANT
GAUZE SPONGE 4X4 12PLY STRL LF (GAUZE/BANDAGES/DRESSINGS) ×2 IMPLANT
GLOVE BIO SURGEON STRL SZ7.5 (GLOVE) ×4 IMPLANT
GLOVE BIOGEL PI IND STRL 7.0 (GLOVE) ×3 IMPLANT
GLOVE BIOGEL PI IND STRL 7.5 (GLOVE) ×1 IMPLANT
GLOVE BIOGEL PI INDICATOR 7.0 (GLOVE) ×3
GLOVE BIOGEL PI INDICATOR 7.5 (GLOVE) ×1
GLOVE SURG SS PI 6.5 STRL IVOR (GLOVE) ×2 IMPLANT
GOWN PREVENTION PLUS LG XLONG (DISPOSABLE) ×6 IMPLANT
KIT ABG SYR 3ML LUER SLIP (SYRINGE) ×2 IMPLANT
NEEDLE HYPO 22GX1.5 SAFETY (NEEDLE) IMPLANT
NEEDLE HYPO 25X5/8 SAFETYGLIDE (NEEDLE) IMPLANT
NS IRRIG 1000ML POUR BTL (IV SOLUTION) ×2 IMPLANT
PACK C SECTION WH (CUSTOM PROCEDURE TRAY) ×2 IMPLANT
RETRACTOR WND ALEXIS 25 LRG (MISCELLANEOUS) ×1 IMPLANT
RTRCTR WOUND ALEXIS 25CM LRG (MISCELLANEOUS) ×2
SLEEVE SCD COMPRESS KNEE MED (MISCELLANEOUS) IMPLANT
STRIP CLOSURE SKIN 1/2X4 (GAUZE/BANDAGES/DRESSINGS) ×2 IMPLANT
SUT CHROMIC 2 0 CT 1 (SUTURE) ×2 IMPLANT
SUT MNCRL AB 3-0 PS2 27 (SUTURE) ×2 IMPLANT
SUT PLAIN 0 NONE (SUTURE) IMPLANT
SUT PLAIN 2 0 XLH (SUTURE) ×2 IMPLANT
SUT VIC AB 0 CT1 36 (SUTURE) ×2 IMPLANT
SUT VIC AB 0 CTX 36 (SUTURE) ×3
SUT VIC AB 0 CTX36XBRD ANBCTRL (SUTURE) ×3 IMPLANT
SYR CONTROL 10ML LL (SYRINGE) IMPLANT
TAPE CLOTH SURG 4X10 WHT LF (GAUZE/BANDAGES/DRESSINGS) ×2 IMPLANT
TOWEL OR 17X24 6PK STRL BLUE (TOWEL DISPOSABLE) ×4 IMPLANT
TRAY FOLEY CATH 14FR (SET/KITS/TRAYS/PACK) ×2 IMPLANT
WATER STERILE IRR 1000ML POUR (IV SOLUTION) ×2 IMPLANT

## 2011-03-05 NOTE — Anesthesia Postprocedure Evaluation (Signed)
  Anesthesia Post-op Note  Patient: Aimee Lynch  Procedure(s) Performed: Procedure(s) (LRB): CESAREAN SECTION (N/A)  Patient Location: PACU  Anesthesia Type: Spinal  Level of Consciousness: awake, alert  and oriented  Airway and Oxygen Therapy: Patient Spontanous Breathing  Post-op Pain: none  Post-op Assessment: Post-op Vital signs reviewed, Patient's Cardiovascular Status Stable, Respiratory Function Stable, Patent Airway, No signs of Nausea or vomiting, Pain level controlled, No headache and No backache  Post-op Vital Signs: Reviewed and stable  Complications: No apparent anesthesia complications

## 2011-03-05 NOTE — Op Note (Signed)
Cesarean Section Procedure Note  Indications: 1.34wks 2.PPROM 3.Labor 4.Breech  Pre-operative Diagnosis: breech presentation/ruptured membranes   Post-operative Diagnosis: breech presentation/ruptured membranes  Procedure: CESAREAN SECTION  Surgeon: Esmeralda Arthur, MD    Assistants: Denny Levy, CNM  Anesthesia: Regional  Anesthesiologist: Tyrone Apple. Malen Gauze, MD   Procedure Details  The patient was taken to the operating room secondary to labor and pprom after the risks, benefits, complications, treatment options, and expected outcomes were discussed with the patient.  The patient concurred with the proposed plan, giving informed consent which was signed and witnessed. The patient was taken to Operating Room 9 (OR Suite), identified as Maurene Hollin and the procedure verified as C-Section Delivery. A Time Out was held and the above information confirmed.  After induction of anesthesia by obtaining a spinal, the patient was prepped and draped in the usual sterile manner. A Pfannenstiel skin incision was made and carried down through the subcutaneous tissue to the underlying layer of fascia.  The fascia was incised bilaterally and extended transversely bilaterally with the Mayo scissors. Kocher clamps were placed on the inferior aspect of the fascial incision and the underlying rectus muscle was separated from the fascia. The same was done on the superior aspect of the fascial incision.  The peritoneum was identified, entered bluntly and extended manually. The utero-vesical peritoneal reflection was incised transversely and the bladder flap was bluntly freed from the lower uterine segment. A low transverse uterine incision was made with the scalpel and extended bilaterally with the bandage scissors.  The infant was delivered in breech position without difficulty.  After the umbilical cord was clamped and cut, the infant was handed to the awaiting pediatricians.  Cord blood was obtained for  evaluation.  The placenta was removed intact and appeared to be within normal limits. The uterus was cleared of all clots and debris. The uterine incision was closed with running interlocking sutures of 0 Vicryl and a second imbricating layer was performed as well.   Bilateral tubes and ovaries appeared to be within normal limits.  Good hemostasis was noted.  Copious irrigation was performed until clear.  The peritoneum was repaired with 2-0 chromic via a running suture.  The fascia was reapproximated with a running suture of 0 Vicryl. The subcutaneous tissue was reapproximated with 3 interrupted sutures of 2-0 plain.  The skin was reapproximated with a subcuticular suture of 3-0 monocryl.  Steristrips were applied.  Instrument, sponge, and needle counts were correct prior to abdominal closure and at the conclusion of the case.  The patient was awaiting transfer to the recovery room in good condition.  Findings: Live female infant with Apgars 8 at one minute and 9 at five minutes.  Normal appearing bilateral ovaries and fallopian tubes were noted.  Estimated Blood Loss:  600 ml         Drains: foley to gravity 200 ml         Total IV Fluids: 2500 ml         Specimens to Pathology: Placenta         Complications:  None; patient tolerated the procedure well.         Disposition: PACU - hemodynamically stable.         Condition: stable  Attending Attestation: I performed the procedure.

## 2011-03-05 NOTE — Interval H&P Note (Signed)
History and Physical Interval Note:  03/05/2011 1:14 PM  Aimee Lynch  has presented today for surgery, with the diagnosis of breech presentation/ruptured membranes  The various methods of treatment have been discussed with the patient and family. After consideration of risks, benefits and other options for treatment, the patient has consented to  Procedure(s) (LRB): CESAREAN SECTION (N/A) as a surgical intervention .  The patients' history has been reviewed, patient examined, no change in status, stable for surgery.  I have reviewed the patients' chart and labs.  Questions were answered to the patient's satisfaction.     Aimee Lynch  Agree with above.  Pt is PPROM and active labor and breech.  Plan to go to OR now for c-section.  R/B/A discussed and consent to be signed and witnessed.

## 2011-03-05 NOTE — Progress Notes (Signed)
Subjective: Called by pt's RN around 1225 for cervical check secondary to pt contracting every 7-10 minutes and asking for pain medicine.  Had u/s this morning and fetus still breech presentation.    Objective: BP 112/63  Pulse 96  Temp(Src) 97.8 F (36.6 C) (Oral)  Resp 20  Ht 5' 2" (1.575 m)  Wt 83.961 kg (185 lb 1.6 oz)  BMI 33.86 kg/m2  SpO2 99% I/O last 3 completed shifts: In: 3 [I.V.:3] Out: -     FHT:  FHR: 135 bpm, variability: moderate,  accelerations:  Present,  decelerations:  Absent UC:   irregular, every 7-10 minutes SVE:   4+/80;-1 breech  Assessment / Plan: 1.  34 weeks  2. active labor  4.  breech  5.  PPROM & s/p BMZ  Preeclampsia:  no signs or symptoms of toxicity Fetal Wellbeing:  Category I Anticipated MOD:  c/s secondary to breech 1.  Will prep for OR  2.  Dr. Roberts updated and posting c/s  Edel Rivero H 03/05/2011, 12:52 PM   

## 2011-03-05 NOTE — Anesthesia Procedure Notes (Signed)
Spinal  Patient location during procedure: OR Start time: 03/05/2011 1:28 PM Staffing Anesthesiologist: Julaine Zimny A. Performed by: anesthesiologist  Preanesthetic Checklist Completed: patient identified, site marked, surgical consent, pre-op evaluation, timeout performed, IV checked, risks and benefits discussed and monitors and equipment checked Spinal Block Patient position: sitting Prep: site prepped and draped and DuraPrep Patient monitoring: heart rate, cardiac monitor, continuous pulse ox and blood pressure Approach: midline Location: L3-4 Injection technique: single-shot Needle Needle type: Sprotte  Needle gauge: 24 G Needle length: 9 cm Needle insertion depth: 5 cm Assessment Sensory level: T3 Additional Notes Patient tolerated procedure well. Adequate sensory level.

## 2011-03-05 NOTE — H&P (View-Only) (Signed)
Subjective: Called by pt's RN around 1225 for cervical check secondary to pt contracting every 7-10 minutes and asking for pain medicine.  Had u/s this morning and fetus still breech presentation.    Objective: BP 112/63  Pulse 96  Temp(Src) 97.8 F (36.6 C) (Oral)  Resp 20  Ht 5\' 2"  (1.575 m)  Wt 83.961 kg (185 lb 1.6 oz)  BMI 33.86 kg/m2  SpO2 99% I/O last 3 completed shifts: In: 3 [I.V.:3] Out: -     FHT:  FHR: 135 bpm, variability: moderate,  accelerations:  Present,  decelerations:  Absent UC:   irregular, every 7-10 minutes SVE:   4+/80;-1 breech  Assessment / Plan: 1.  34 weeks  2. active labor  4.  breech  5.  PPROM & s/p BMZ  Preeclampsia:  no signs or symptoms of toxicity Fetal Wellbeing:  Category I Anticipated MOD:  c/s secondary to breech 1.  Will prep for OR  2.  Dr. Su Hilt updated and posting c/s  Aimee Lynch 03/05/2011, 12:52 PM

## 2011-03-05 NOTE — Transfer of Care (Signed)
Immediate Anesthesia Transfer of Care Note  Patient: Aimee Lynch  Procedure(s) Performed: Procedure(s) (LRB): CESAREAN SECTION (N/A)  Patient Location: PACU  Anesthesia Type: Spinal  Level of Consciousness: awake, alert  and oriented  Airway & Oxygen Therapy: Patient Spontanous Breathing  Post-op Assessment: Report given to PACU RN and Post -op Vital signs reviewed and stable  Post vital signs: stable  Complications: No apparent anesthesia complications

## 2011-03-05 NOTE — Anesthesia Preprocedure Evaluation (Signed)
Anesthesia Evaluation  Patient identified by MRN, date of birth, ID band Patient awake    Reviewed: Allergy & Precautions, H&P , Patient's Chart, lab work & pertinent test results  Airway Mallampati: III TM Distance: >3 FB Neck ROM: Full    Dental No notable dental hx. (+) Teeth Intact   Pulmonary neg pulmonary ROS,  clear to auscultation  Pulmonary exam normal       Cardiovascular + Valvular Problems/Murmurs MR Regular Normal    Neuro/Psych Negative Neurological ROS  Negative Psych ROS   GI/Hepatic negative GI ROS, Neg liver ROS,   Endo/Other  Negative Endocrine ROS  Renal/GU negative Renal ROS  Genitourinary negative   Musculoskeletal   Abdominal Normal abdominal exam  (+)   Peds  Hematology negative hematology ROS (+)   Anesthesia Other Findings   Reproductive/Obstetrics (+) Pregnancy                           Anesthesia Physical Anesthesia Plan  ASA: II and Emergent  Anesthesia Plan: Spinal   Post-op Pain Management:    Induction:   Airway Management Planned:   Additional Equipment:   Intra-op Plan:   Post-operative Plan:   Informed Consent: I have reviewed the patients History and Physical, chart, labs and discussed the procedure including the risks, benefits and alternatives for the proposed anesthesia with the patient or authorized representative who has indicated his/her understanding and acceptance.     Plan Discussed with: Anesthesiologist, CRNA and Surgeon  Anesthesia Plan Comments:         Anesthesia Quick Evaluation

## 2011-03-06 LAB — CBC
HCT: 26.7 % — ABNORMAL LOW (ref 36.0–46.0)
Hemoglobin: 9 g/dL — ABNORMAL LOW (ref 12.0–15.0)
MCHC: 33.7 g/dL (ref 30.0–36.0)
WBC: 15.9 10*3/uL — ABNORMAL HIGH (ref 4.0–10.5)

## 2011-03-06 NOTE — Addendum Note (Signed)
Addendum  created 03/06/11 1053 by Truitt Leep, CRNA   Modules edited:Notes Section

## 2011-03-06 NOTE — Anesthesia Postprocedure Evaluation (Signed)
  Anesthesia Post-op Note  Patient: Aimee Lynch  Procedure(s) Performed: Procedure(s) (LRB): CESAREAN SECTION (N/A)  Patient Location: PACU and Women's Unit  Anesthesia Type: Spinal  Level of Consciousness: awake, alert  and oriented  Airway and Oxygen Therapy: Patient Spontanous Breathing  Post-op Pain: mild  Post-op Assessment: Patient's Cardiovascular Status Stable and Respiratory Function Stable  Post-op Vital Signs: stable  Complications: No apparent anesthesia complications

## 2011-03-06 NOTE — Progress Notes (Signed)
Visited with Aimee Lynch and her family while rounding on unit. Pt was doing well and reported that baby, Aimee Lynch, was doing well.  Offered well wishes and affirmations.  Please page if need arises.  [phone]  [name] [name] 11:25 AM   03/06/11 1100  Clinical Encounter Type  Visited With Patient and family together  Visit Type Initial

## 2011-03-06 NOTE — Progress Notes (Signed)
Subjective: Postpartum Day 1: Cesarean Delivery Patient reports no problems voiding.  Ambulating without difficulty  Objective: Vital signs in last 24 hours: Temp:  [97.7 F (36.5 C)-98.8 F (37.1 C)] 98.1 F (36.7 C) (02/15 1200) Pulse Rate:  [71-99] 99  (02/15 1200) Resp:  [13-20] 18  (02/15 1200) BP: (98-129)/(53-78) 110/69 mmHg (02/15 1200) SpO2:  [94 %-99 %] 97 % (02/15 1200) Weight:  [185 lb 1.6 oz (83.961 kg)] 185 lb 1.6 oz (83.961 kg) (02/14 1700)  Physical Exam:  General: alert, cooperative and no distress Lochia: appropriate Uterine Fundus: firm Incision: dressing is dry DVT Evaluation: No evidence of DVT seen on physical exam.   Basename 03/06/11 0545 03/05/11 1245  HGB 9.0* 11.9*  HCT 26.7* 35.9*    Assessment/Plan: Status post Cesarean section. Doing well postoperatively.  Continue current care.  Keidan Aumiller P 03/06/2011, 1:55 PM

## 2011-03-06 NOTE — Progress Notes (Signed)
UR Chart review completed.  

## 2011-03-06 NOTE — Progress Notes (Signed)
Sw attempted to meet with the pt to complete assessment however pt has visitors present.  Sw will return at a later time.

## 2011-03-07 ENCOUNTER — Encounter (HOSPITAL_COMMUNITY)
Admission: RE | Admit: 2011-03-07 | Discharge: 2011-03-07 | Disposition: A | Discharge status: Home / Self Care | Payer: Managed Care, Other (non HMO) | Source: Ambulatory Visit | Attending: Obstetrics and Gynecology | Admitting: Obstetrics and Gynecology

## 2011-03-07 DIAGNOSIS — O923 Agalactia: Secondary | ICD-10-CM | POA: Insufficient documentation

## 2011-03-07 MED ORDER — POLYSACCHARIDE IRON 150 MG PO CAPS
150.0000 mg | ORAL_CAPSULE | Freq: Two times a day (BID) | ORAL | Status: DC
  Administered 2011-03-07 – 2011-03-08 (×3): 150 mg via ORAL
  Filled 2011-03-07 (×3): qty 1

## 2011-03-07 NOTE — Progress Notes (Signed)
PSYCHOSOCIAL ASSESSMENT ~ MATERNAL/CHILD Name:  Aimee Lynch Age 33 days Referral Date 03/07/11 Reason/Source  NICU Referral  I. FAMILY/HOME ENVIRONMENT A. Child's Legal Guardian  Parent(s) X Aimee Lynch parent    DSS  Name  Aimee Lynch [facility]) DOB Jan 10, 1981 Age  [address] Address [address] [place], [place]  [address] Name Aimee Lynch (FOB) DOB Age  Address Same as above C.   Other Support   II. PSYCHOSOCIAL DATA A. Information Source  Patient Interview X  Family Interview           Other B. Event organiser  Employment  [employer]      [employer]   [employer]                              Self Pay   Food Stamps      WIC  Work Scientist, physiological Housing      Section 8     Maternity Care Coordination/Child Service Coordination/Early Intervention    School  Grade      Other Cultural and Environment Information Cultural Issues Impacting Care  III. STRENGTHS  Supportive family/friends Yes   Adequate Resources  Yes Compliance with medical plan  Yes  Home prepared for Child (including basic supplies)  Yes Understanding of illness  Yes         Other  IV. RISK FACTORS AND CURRENT PROBLEMS      V.  No Problems Note VI. Substance Abuse                                           Pt Family             Mental Illness     Pt Family               Family/Relationship Issues   Pt Family      Abuse/Neglect/Domestic Violence   Pt Family   Financial Resources     Pt Family  Transportation     Pt Family  DSS Involvement    Pt Family  Adjustment to Illness    Pt Family   Knowledge/Cognitive Deficit   Pt Family   Compliance with Treatment   Pt Family   Basic Needs (food, housing, etc)  Pt Family  Housing Concerns    Pt Family  Other             VII. SOCIAL WORK ASSESSMENT SW received referral for NICU admission.  MOB, [name], stated she was admitted to the hospital on 02/24/11 due to pre-term labor and delivered on 03/05/11 at 34 weeks.  She  said the MD's and staff have been very informative.  Pt has supplies and will eventually return to work at [employer].  Pt's husband was also present.  MOB stated her mom and FOB's mom are very supportive and are available to provide supplies as needed.  She expressed adjusting to not being able to go home with her baby and is recovering from her delivery.  Provided NICU/SW Brochure.  VIII. SOCIAL WORK PLAN (In [place]) No Further Intervention Required/No Barriers to Discharge Psychosocial Support and Ongoing Assessment of Needs Patient/Family  Education Child Protective Services Report  [place]  Date Information/Referral to [facility]   Other

## 2011-03-07 NOTE — Progress Notes (Signed)
Subjective: Postpartum Day 2: Cesarean Delivery Patient reports tolerating PO, + flatus and no problems voiding.   Pumping colostrum  Objective: Vital signs in last 24 hours: Temp:  [98.1 F (36.7 C)-98.5 F (36.9 C)] 98.5 F (36.9 C) (02/16 0549) Pulse Rate:  [68-118] 118  (02/16 1046) Resp:  [18] 18  (02/16 1044) BP: (108-128)/(65-93) 126/78 mmHg (02/16 1046) SpO2:  [97 %-100 %] 100 % (02/16 1046)  Physical Exam:  General: alert, cooperative and no distress Lochia: appropriate Uterine Fundus: firm Incision: healing well DVT Evaluation: No evidence of DVT seen on physical exam.   Basename 03/06/11 0545 03/05/11 1245  HGB 9.0* 11.9*  HCT 26.7* 35.9*    Assessment/Plan: Status post Cesarean section. Doing well postoperatively.  Continue current care Probable DC home 03/08/11.  Aimee Lynch P 03/07/2011, 11:11 AM

## 2011-03-08 MED ORDER — FE BISGLYCINATE-FE POLYSAC 40-20 MG PO CAPS: 1.0000 | ORAL_CAPSULE | Freq: Two times a day (BID) | ORAL | Status: AC

## 2011-03-08 MED ORDER — OXYCODONE-ACETAMINOPHEN 5-325 MG PO TABS: 1.0000 | ORAL_TABLET | ORAL | Status: AC | PRN

## 2011-03-08 MED ORDER — IBUPROFEN 600 MG PO TABS: 600.0000 mg | ORAL_TABLET | Freq: Four times a day (QID) | ORAL | Status: AC | PRN

## 2011-03-08 NOTE — Discharge Summary (Signed)
Obstetric Discharge Summary Reason for Admission: PPROM at 31wks Prenatal Procedures: NST and ultrasound Intrapartum Procedures: cesarean: low cervical, transverse and spinal anesthesia Postpartum Procedures: none Complications-Operative and Postpartum: none  Temp:  [97.9 F (36.6 C)-98.3 F (36.8 C)] 98.3 F (36.8 C) (02/17 0616) Pulse Rate:  [82-86] 82  (02/17 0616) Resp:  [18-20] 18  (02/17 0616) BP: (115-118)/(74-84) 115/76 mmHg (02/17 0616) SpO2:  [98 %-99 %] 99 % (02/17 0616) Hemoglobin  Date Value Range Status  03/06/2011 9.0* 12.0-15.0 (g/dL) Final     DELTA CHECK NOTED     REPEATED TO VERIFY     HCT  Date Value Range Status  03/06/2011 26.7* 36.0-46.0 (%) Final    Hospital Course:  Hospital Course: Admitted with PPROM. No evidence of active labor. Pt was started on antibiotics and BMZ course.  She remained stable, with one episode of BRB, on 2/11. On 2/14, she began having stronger ctx and was dilated to 4cm. Infant was still breech. She was then prepared for C/S.  Delivery was performed by Dr. Su Hilt without difficulty. Patient and baby tolerated the procedure without difficulty. Infant to NICU. Mother then had an uncomplicated postpartum course, she was pumping and beginning to get increased amounts of colostrum on 3rd day. Mom's physical exam was WNL, and she was discharged home in stable condition. Contraception plan was undecided.  She received adequate benefit from po pain medications.  Consults: MFM, NICU, nutrition, social work, chaplain   Discharge Diagnoses: pre-term ruptured membranes, delivered, anemia  Discharge Information: Date: 03/08/2011 Activity: pelvic rest Diet: routine, iron-rich Medications:  Medication List  As of 03/08/2011  5:53 PM   START taking these medications         ibuprofen 600 MG tablet   Commonly known as: ADVIL,MOTRIN   Take 1 tablet (600 mg total) by mouth every 6 (six) hours as needed for pain.      iron polysaccharides  40-20 MG capsule   Commonly known as: NIFEREX 60   Take 1 capsule by mouth 2 (two) times daily with a meal.      oxyCODONE-acetaminophen 5-325 MG per tablet   Commonly known as: PERCOCET   Take 1-2 tablets by mouth every 3 (three) hours as needed (moderate - severe pain).         CONTINUE taking these medications         prenatal multivitamin Tabs      TUMS PO         STOP taking these medications         acetaminophen 325 MG tablet          Where to get your medications    These are the prescriptions that you need to pick up.   You may get these medications from any pharmacy.         ibuprofen 600 MG tablet   iron polysaccharides 40-20 MG capsule   oxyCODONE-acetaminophen 5-325 MG per tablet           Condition: stable Instructions: refer to practice specific booklet Discharge to: home Follow-up Information    Follow up with [facility] A, MD in 6 weeks. (If symptoms worsen)    Contact information:   [address] [place] [address] [place] [address] [phone]          Newborn Data: Live born  Information for the patient's newborn:  Donaghey, Girl Edmonia [[id number]]  female ; APGAR ,8, 9 ; weight ; 4#5oz  Infant remains in NICU  in stable condition  Asuka Dusseau M 03/08/2011, 5:53 PM

## 2011-03-08 NOTE — Discharge Instructions (Signed)
You can take the iron caplets, or Floridex for extra iron supplement.  Also continue your prenatal vitamin.   Iron-Rich Diet An iron-rich diet contains foods that are good sources of iron. Iron is an important mineral that helps your body produce hemoglobin. Hemoglobin is a protein in red blood cells that carries oxygen to the body's tissues. Sometimes, the iron level in your blood can be low. This may be caused by:  A lack of iron in your diet.   Blood loss.   Times of growth, such as during pregnancy or during a child's growth and development.  Low levels of iron can cause a decrease in the number of red blood cells. This can result in iron deficiency anemia. Iron deficiency anemia symptoms include:  Tiredness.   Weakness.   Irritability.   Increased chance of infection.  Here are some recommendations for daily iron intake:  Males older than 33 years of age need 8 mg of iron per day.   Women ages 60 to 58 need 18 mg of iron per day.   Pregnant women need 27 mg of iron per day, and women who are over 71 years of age and breastfeeding need 9 mg of iron per day.   Women over the age of 55 need 8 mg of iron per day.  SOURCES OF IRON There are 2 types of iron that are found in food: heme iron and nonheme iron. Heme iron is absorbed by the body better than nonheme iron. Heme iron is found in meat, poultry, and fish. Nonheme iron is found in grains, beans, and vegetables. Heme Iron Sources Food / Iron (mg)  Chicken liver, 3 oz (85 g)/ 10 mg   Beef liver, 3 oz (85 g)/ 5.5 mg   Oysters, 3 oz (85 g)/ 8 mg   Beef, 3 oz (85 g)/ 2 to 3 mg   Shrimp, 3 oz (85 g)/ 2.8 mg   [place], 3 oz (85 g)/ 2 mg   Chicken, 3 oz (85 g) / 1 mg   Fish (tuna, halibut), 3 oz (85 g)/ 1 mg   Pork, 3 oz (85 g)/ 0.9 mg  Nonheme Iron Sources Food / Iron (mg)  Ready-to-eat breakfast cereal, iron-fortified / 3.9 to 7 mg   Tofu,  cup / 3.4 mg   Kidney beans,  cup / 2.6 mg   Baked potato with  skin / 2.7 mg   Asparagus,  cup / 2.2 mg   Avocado / 2 mg   Dried peaches,  cup / 1.6 mg   Raisins,  cup / 1.5 mg   Soy milk, 1 cup / 1.5 mg   Whole-wheat bread, 1 slice / 1.2 mg   Spinach, 1 cup / 0.8 mg   Broccoli,  cup / 0.6 mg  IRON ABSORPTION Certain foods can decrease the body's absorption of iron. Try to avoid these foods and beverages while eating meals with iron-containing foods:  Coffee.   Tea.   Fiber.   Soy.  Foods containing vitamin C can help increase the amount of iron your body absorbs from iron sources, especially from nonheme sources. Eat foods with vitamin C along with iron-containing foods to increase your iron absorption. Foods that are high in vitamin C include many fruits and vegetables. Some good sources are:  Fresh orange juice.   Oranges.   Strawberries.   Mangoes.   Grapefruit.   Red bell peppers.   Green bell peppers.   Broccoli.   Potatoes with  skin.   Tomato juice.  Document Released: 08/19/2004 Document Revised: 09/17/2010 Document Reviewed: 06/26/2010 [facility] Patient Information 2012 [place], [place].

## 2011-03-08 NOTE — Progress Notes (Signed)
Pt d/c home with family ,ambulatory to NICU. D/C instructions and prescriptions reviewed with pt. Pt verbalized understanding.

## 2011-03-08 NOTE — Progress Notes (Signed)
Patient ID: Aimee Lynch, female   DOB: [birth year], 32 y.o.   MRN: [id number] Subjective: Postpartum Day 3: Cesarean Delivery Ready for D/C, infant in NICU, doing well per pt Patient reports tolerating PO, + flatus, + BM and no problems voiding.   no complaints, up ad lib without syncope Pain well controlled with po meds Pumping breasts, getting more colostrum/milk today Mood stable, bonding well - has done skin-skin in NICU   Objective: Vital signs in last 24 hours: Temp:  [97.9 F (36.6 C)-98.3 F (36.8 C)] 98.3 F (36.8 C) (02/17 0616) Pulse Rate:  [76-86] 82  (02/17 0616) Resp:  [18-20] 18  (02/17 0616) BP: (115-142)/(69-84) 115/76 mmHg (02/17 0616) SpO2:  [96 %-99 %] 99 % (02/17 0616)  Physical Exam:  General: alert and no distress Breasts: WNL Heart: RRR Lungs: CTAB Abdomen: BS x4 Uterine Fundus: firm Incision: healing well, CDI, steri strips intact Lochia: appropriate DVT Evaluation: No evidence of DVT seen on physical exam. Negative Homan's sign. No significant calf/ankle edema.   Basename 03/06/11 0545 03/05/11 1245  HGB 9.0* 11.9*  HCT 26.7* 35.9*    Assessment/Plan: Status post Cesarean section. Doing well postoperatively.  Discharge home with standard precautions and return to clinic in 4-6 weeks. Anemia - stable - continue FE supplement or use Floridex PT undecided RE: contraception, briefly discussed options   Shuayb Schepers M 03/08/2011, 12:23 PM

## 2011-03-09 ENCOUNTER — Encounter (HOSPITAL_COMMUNITY): Payer: Self-pay | Admitting: Obstetrics and Gynecology

## 2011-04-16 ENCOUNTER — Ambulatory Visit (INDEPENDENT_AMBULATORY_CARE_PROVIDER_SITE_OTHER): Payer: Managed Care, Other (non HMO) | Admitting: Obstetrics and Gynecology

## 2011-08-13 ENCOUNTER — Telehealth: Payer: Self-pay

## 2011-08-13 NOTE — Telephone Encounter (Signed)
TC to pt.  Per JO, informed form that she requested to donate breast milk has been completed and faxed.

## 2011-08-13 NOTE — Telephone Encounter (Signed)
Triage/general quest. 

## 2011-10-20 ENCOUNTER — Ambulatory Visit (INDEPENDENT_AMBULATORY_CARE_PROVIDER_SITE_OTHER): Payer: Managed Care, Other (non HMO) | Admitting: Obstetrics and Gynecology

## 2011-10-20 ENCOUNTER — Encounter: Payer: Self-pay | Admitting: Obstetrics and Gynecology

## 2011-10-20 VITALS — BP 124/80 | HR 80 | Wt 176.0 lb

## 2011-10-20 DIAGNOSIS — B373 Candidiasis of vulva and vagina: Secondary | ICD-10-CM

## 2011-10-20 DIAGNOSIS — N898 Other specified noninflammatory disorders of vagina: Secondary | ICD-10-CM

## 2011-10-20 LAB — POCT WET PREP (WET MOUNT): pH: 4.5

## 2011-10-20 MED ORDER — FLUCONAZOLE 150 MG PO TABS: 150.0000 mg | ORAL_TABLET | Freq: Once | ORAL | Status: DC

## 2011-10-20 NOTE — Progress Notes (Signed)
Color: WHITE TO CLEAR Odor: no Itching:yes Thin:yes Thick:yes Fever:no Dyspareunia:no Hx PID:no HX STD:no Pelvic Pain:no Desires Gc/CT:no Desires HIV,RPR,HbsAG:no   PT CHIEF COMPLAINT IS THE VAGINAL ITCHING

## 2011-10-20 NOTE — Patient Instructions (Signed)
Avoid: - excess soap on genital area (consider using plain oatmeal soap) - use of powder or sprays in genital area - douching - wearing underwear to bed (except with menses) - using more than is directed detergent when washing clothes - tight fitting garments around genital area - excess sugar intake  Monilial Vaginitis Vaginitis in a soreness, swelling and redness (inflammation) of the vagina and vulva. Monilial vaginitis is not a sexually transmitted infection. CAUSES  Yeast vaginitis is caused by yeast (candida) that is normally found in your vagina. With a yeast infection, the candida has overgrown in number to a point that upsets the chemical balance. SYMPTOMS   White, thick vaginal discharge.   Swelling, itching, redness and irritation of the vagina and possibly the lips of the vagina (vulva).   Burning or painful urination.   Painful intercourse.  DIAGNOSIS  Things that may contribute to monilial vaginitis are:  Postmenopausal and virginal states.   Pregnancy.   Infections.   Being tired, sick or stressed, especially if you had monilial vaginitis in the past.   Diabetes. Good control will help lower the chance.   Birth control pills.   Tight fitting garments.   Using bubble bath, feminine sprays, douches or deodorant tampons.   Taking certain medications that kill germs (antibiotics).   Sporadic recurrence can occur if you become ill.  TREATMENT  Your caregiver will give you medication.  There are several kinds of anti monilial vaginal creams and suppositories specific for monilial vaginitis. For recurrent yeast infections, use a suppository or cream in the vagina 2 times a week, or as directed.   Anti-monilial or steroid cream for the itching or irritation of the vulva may also be used. Get your caregiver's permission.   Painting the vagina with methylene blue solution may help if the monilial cream does not work.   Eating yogurt may help prevent monilial  vaginitis.  HOME CARE INSTRUCTIONS   Finish all medication as prescribed.   Do not have sex until treatment is completed or after your caregiver tells you it is okay.   Take warm sitz baths.   Do not douche.   Do not use tampons, especially scented ones.   Wear cotton underwear.   Avoid tight pants and panty hose.   Tell your sexual partner that you have a yeast infection. They should go to their caregiver if they have symptoms such as mild rash or itching.   Your sexual partner should be treated as well if your infection is difficult to eliminate.   Practice safer sex. Use condoms.   Some vaginal medications cause latex condoms to fail. Vaginal medications that harm condoms are:   Cleocin cream.   Butoconazole (Femstat).   Terconazole (Terazol) vaginal suppository.   Miconazole (Monistat) (may be purchased over the counter).  SEEK MEDICAL CARE IF:   You have a temperature by mouth above 102 F (38.9 C).   The infection is getting worse after 2 days of treatment.   The infection is not getting better after 3 days of treatment.   You develop blisters in or around your vagina.   You develop vaginal bleeding, and it is not your menstrual period.   You have pain when you urinate.   You develop intestinal problems.   You have pain with sexual intercourse.  Document Released: 10/15/2004 Document Revised: 12/25/2010 Document Reviewed: 06/29/2008 [facility] Patient Information 2012 [place], [place].

## 2011-10-20 NOTE — Progress Notes (Signed)
31 YO complains of vaginal itching with irritation.  Has noticed increased vaginal discharge requiring the use of a panty liner.  O: Pelvic: EGBUS- mild erythema, vagina-moderate white discharge, cervix/uterus-normal  Wet Prep:  pH-4.5,  whiff-negative,  many yeast  A: Yeast Vaginitis  P: Diflucan 150 mg #1 1 po stat 1 refill       Perineal hygiene       RTO-as scheduled or prn  Laverna Dossett, PA-C

## 2012-05-23 IMAGING — US US OB FOLLOW-UP
1 series · 14 of 28 positions shown · non-contrast
Comparison: none

[Series 1: us ob follow-up · 0.28mm/px · 14 of 50 slices shown]
[im 2/50]
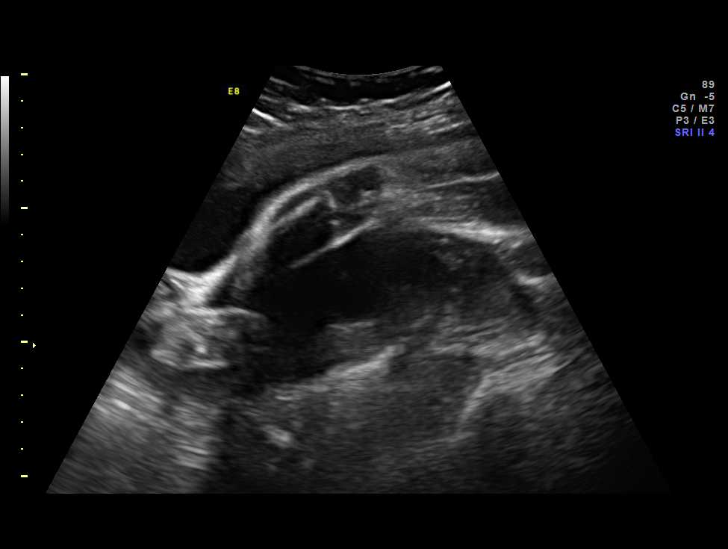
[im 6/50]
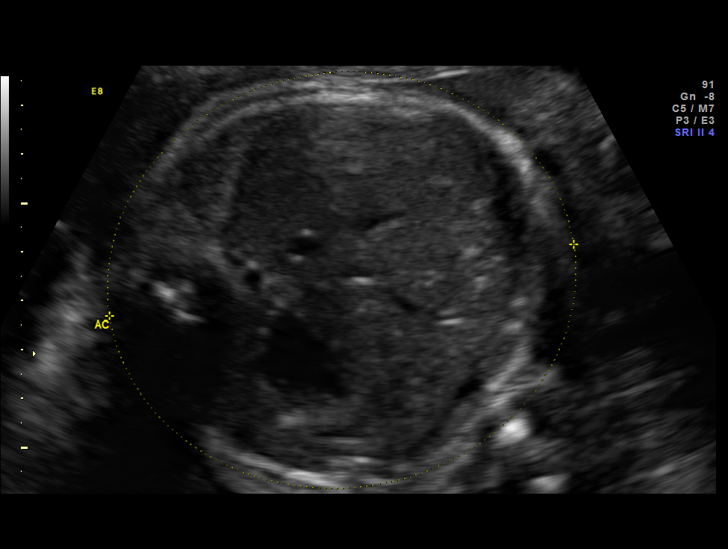
[im 10/50]
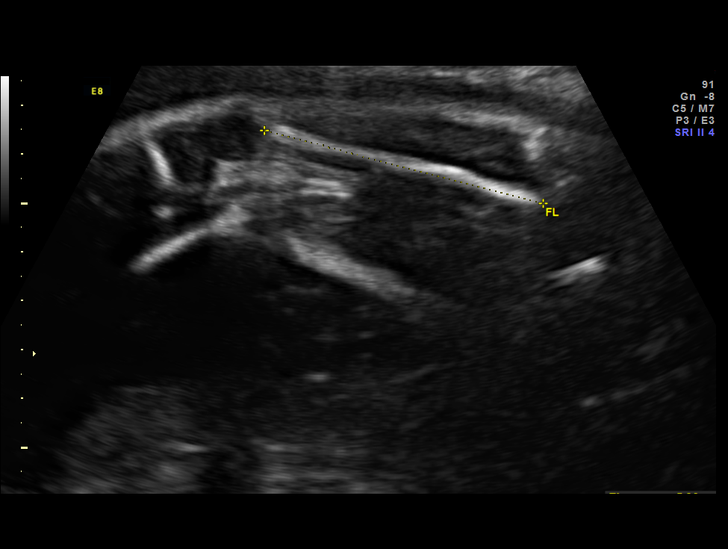
[im 13/50]
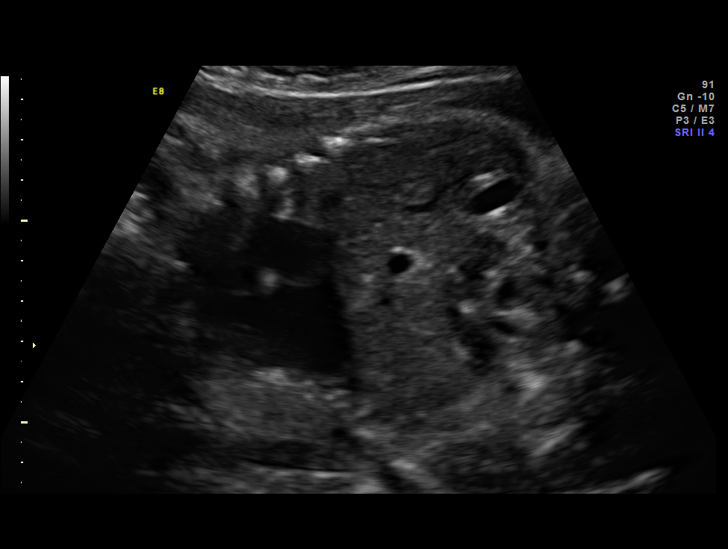
[im 17/50]
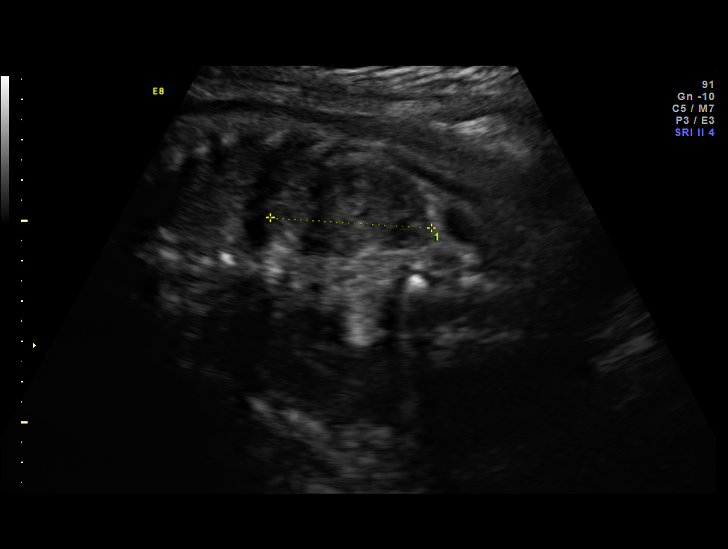
[im 20/50]
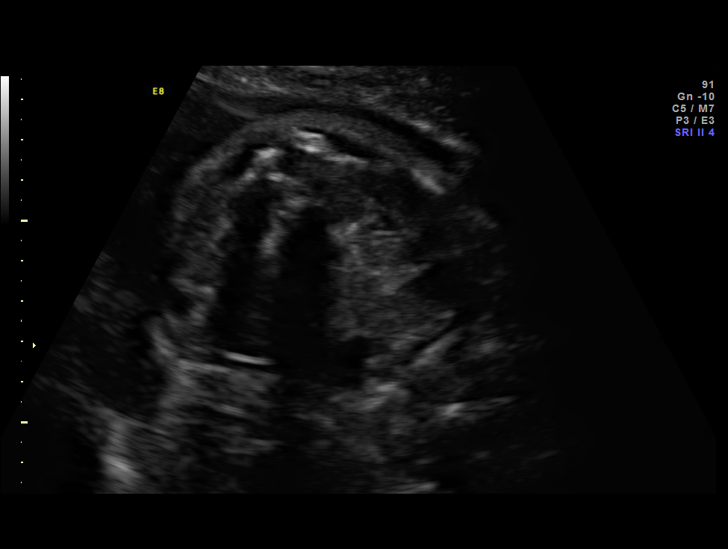
[im 24/50]
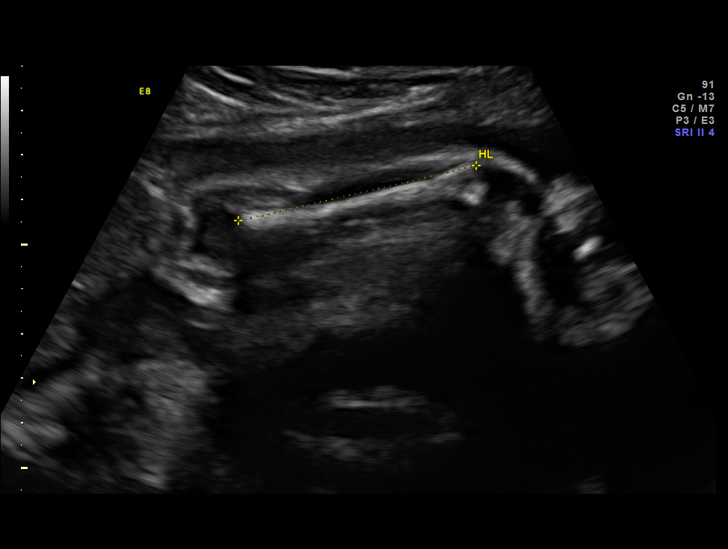
[im 28/50]
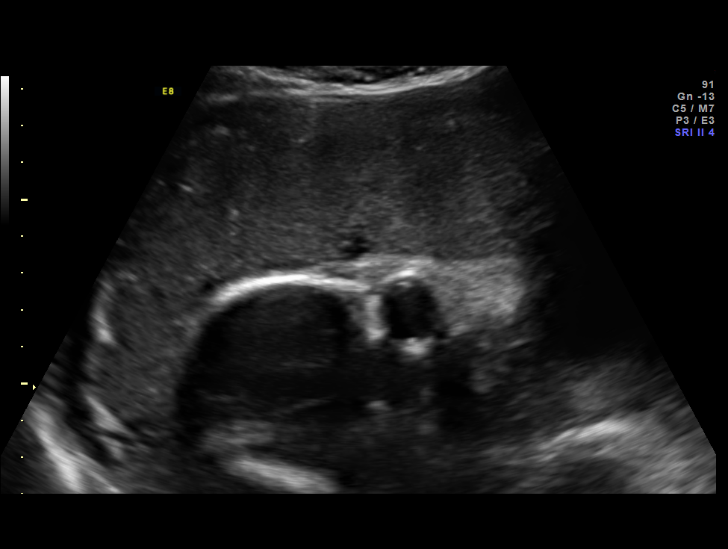
[im 31/50]
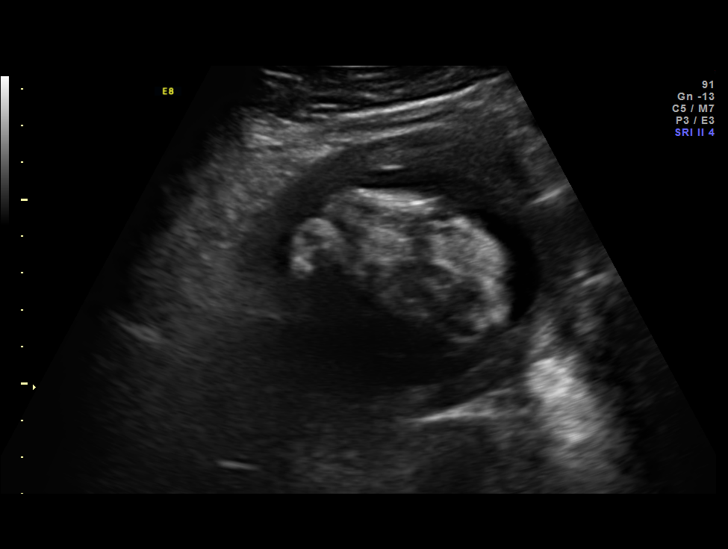
[im 35/50]
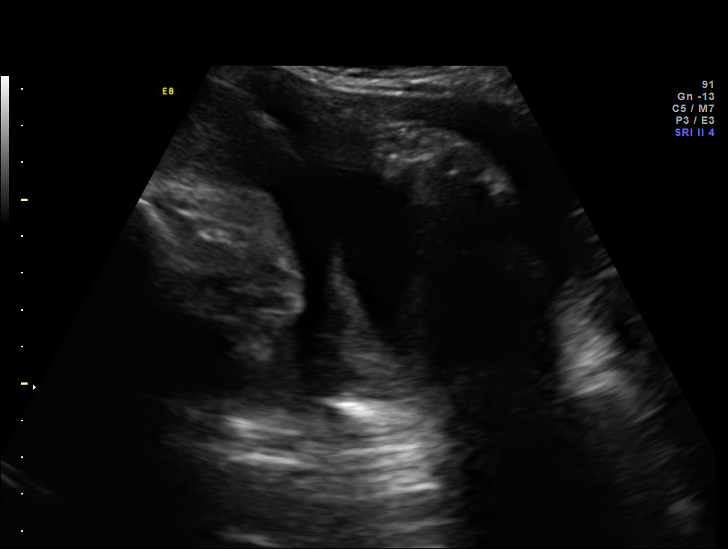
[im 39/50]
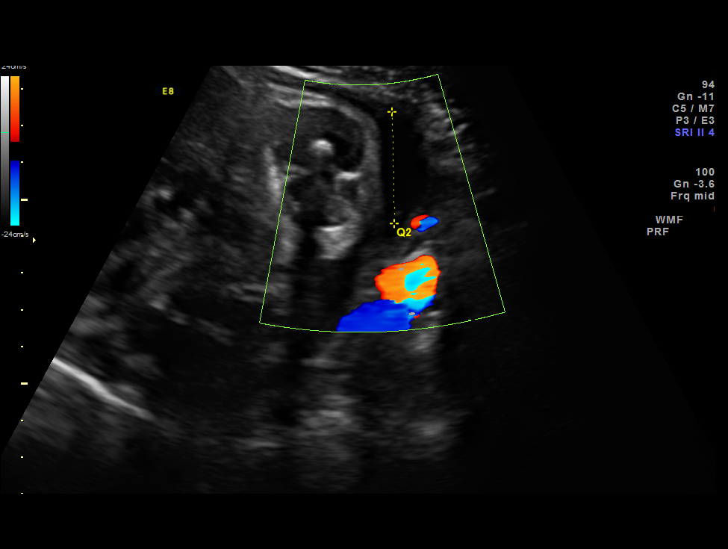
[im 42/50]
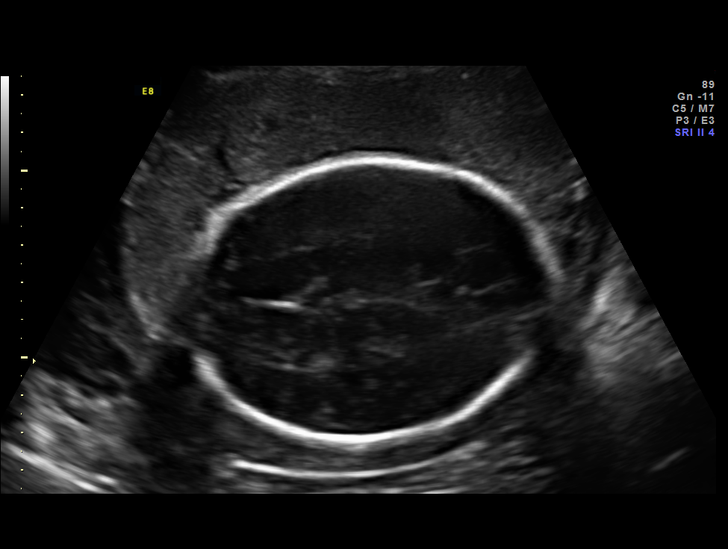
[im 46/50]
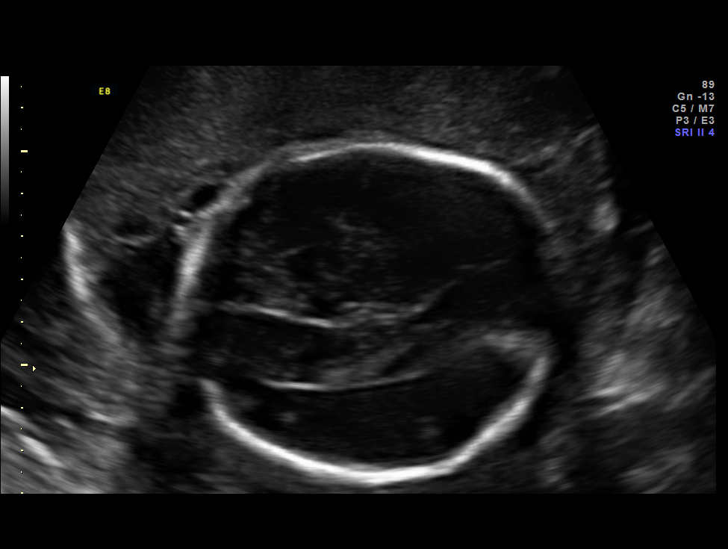
[im 50/50]
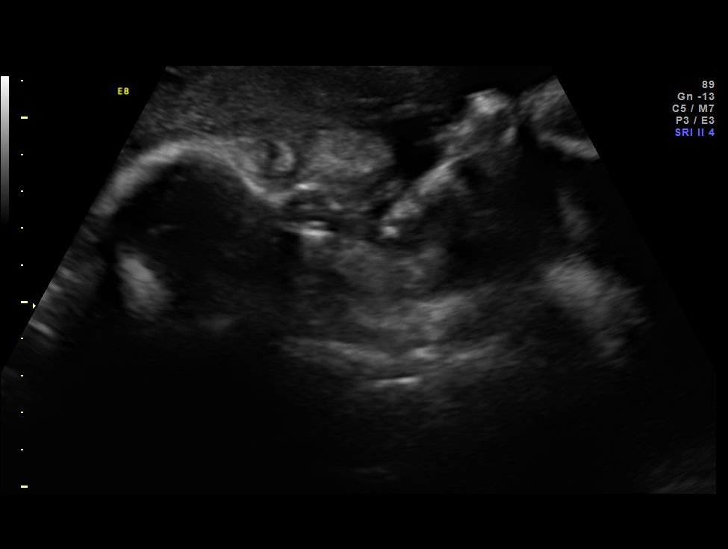

[14 of 28 positions shown; findings below may reference images not displayed]

Canned report from images found in remote index.

Refer to host system for actual result text.

## 2013-06-03 ENCOUNTER — Encounter (HOSPITAL_COMMUNITY): Payer: Self-pay | Admitting: Emergency Medicine

## 2013-06-03 ENCOUNTER — Emergency Department (HOSPITAL_COMMUNITY): Payer: Managed Care, Other (non HMO)

## 2013-06-03 ENCOUNTER — Emergency Department (HOSPITAL_COMMUNITY)
Admission: EM | Admit: 2013-06-03 | Discharge: 2013-06-03 | Disposition: A | Discharge status: Home / Self Care | Payer: Managed Care, Other (non HMO) | Attending: Emergency Medicine | Admitting: Emergency Medicine

## 2013-06-03 DIAGNOSIS — W19XXXA Unspecified fall, initial encounter: Secondary | ICD-10-CM

## 2013-06-03 DIAGNOSIS — S4980XA Other specified injuries of shoulder and upper arm, unspecified arm, initial encounter: Secondary | ICD-10-CM | POA: Insufficient documentation

## 2013-06-03 DIAGNOSIS — Y9389 Activity, other specified: Secondary | ICD-10-CM | POA: Insufficient documentation

## 2013-06-03 DIAGNOSIS — W1789XA Other fall from one level to another, initial encounter: Secondary | ICD-10-CM | POA: Insufficient documentation

## 2013-06-03 DIAGNOSIS — S060X9A Concussion with loss of consciousness of unspecified duration, initial encounter: Secondary | ICD-10-CM

## 2013-06-03 DIAGNOSIS — S060XAA Concussion with loss of consciousness status unknown, initial encounter: Secondary | ICD-10-CM

## 2013-06-03 DIAGNOSIS — IMO0002 Reserved for concepts with insufficient information to code with codable children: Secondary | ICD-10-CM | POA: Insufficient documentation

## 2013-06-03 DIAGNOSIS — Z862 Personal history of diseases of the blood and blood-forming organs and certain disorders involving the immune mechanism: Secondary | ICD-10-CM | POA: Insufficient documentation

## 2013-06-03 DIAGNOSIS — R011 Cardiac murmur, unspecified: Secondary | ICD-10-CM | POA: Insufficient documentation

## 2013-06-03 DIAGNOSIS — S46909A Unspecified injury of unspecified muscle, fascia and tendon at shoulder and upper arm level, unspecified arm, initial encounter: Secondary | ICD-10-CM | POA: Insufficient documentation

## 2013-06-03 DIAGNOSIS — Y92009 Unspecified place in unspecified non-institutional (private) residence as the place of occurrence of the external cause: Secondary | ICD-10-CM | POA: Insufficient documentation

## 2013-06-03 LAB — HCG, SERUM, QUALITATIVE: Preg, Serum: NEGATIVE

## 2013-06-03 MED ORDER — ONDANSETRON HCL 4 MG/2ML IJ SOLN
4.0000 mg | Freq: Once | INTRAMUSCULAR | Status: AC
  Administered 2013-06-03: 4 mg via INTRAVENOUS
  Filled 2013-06-03: qty 2

## 2013-06-03 MED ORDER — MECLIZINE HCL 50 MG PO TABS: 50.0000 mg | ORAL_TABLET | Freq: Three times a day (TID) | ORAL | Status: DC | PRN

## 2013-06-03 MED ORDER — MECLIZINE HCL 25 MG PO TABS
50.0000 mg | ORAL_TABLET | Freq: Once | ORAL | Status: AC
  Administered 2013-06-03: 50 mg via ORAL
  Filled 2013-06-03: qty 2

## 2013-06-03 MED ORDER — OXYCODONE-ACETAMINOPHEN 5-325 MG PO TABS: ORAL_TABLET | ORAL | Status: DC

## 2013-06-03 MED ORDER — ONDANSETRON HCL 4 MG PO TABS: 4.0000 mg | ORAL_TABLET | Freq: Four times a day (QID) | ORAL | Status: DC | PRN

## 2013-06-03 MED ORDER — DIAZEPAM 5 MG PO TABS: 5.0000 mg | ORAL_TABLET | Freq: Four times a day (QID) | ORAL | Status: DC | PRN

## 2013-06-03 MED ORDER — LORAZEPAM 2 MG/ML IJ SOLN
1.0000 mg | Freq: Once | INTRAMUSCULAR | Status: AC
  Administered 2013-06-03: 1 mg via INTRAVENOUS
  Filled 2013-06-03: qty 1

## 2013-06-03 MED ORDER — DIAZEPAM 5 MG PO TABS: 5.0000 mg | ORAL_TABLET | Freq: Once | ORAL | Status: DC

## 2013-06-03 MED ORDER — KETOROLAC TROMETHAMINE 15 MG/ML IJ SOLN
15.0000 mg | Freq: Once | INTRAMUSCULAR | Status: AC
  Administered 2013-06-03: 15 mg via INTRAVENOUS
  Filled 2013-06-03: qty 1

## 2013-06-03 MED ORDER — METOCLOPRAMIDE HCL 5 MG/ML IJ SOLN
10.0000 mg | Freq: Once | INTRAMUSCULAR | Status: AC
  Administered 2013-06-03: 10 mg via INTRAVENOUS
  Filled 2013-06-03: qty 2

## 2013-06-03 MED ORDER — MORPHINE SULFATE 4 MG/ML IJ SOLN
4.0000 mg | Freq: Once | INTRAMUSCULAR | Status: AC
  Administered 2013-06-03: 4 mg via INTRAVENOUS
  Filled 2013-06-03: qty 1

## 2013-06-03 NOTE — Discharge Instructions (Signed)
Do not participate in any sports or any activities that could result in head trauma until you are cleared by your primary care physician or neurologist.   Please follow with your primary care doctor in the next 2 days for a check-up. They must obtain records for further management.   Do not hesitate to return to the Emergency Department for any new, worsening or concerning symptoms.   Take percocet for breakthrough pain, do not drink alcohol, drive, care for children or do other critical tasks while taking percocet.  For pain control you may take up to 800mg  of ibuprofen (that is usually 4 over the counter pills)  3 times a day (take with food) and acetaminophen 975mg  (this is 3 over the counter pills) four times a day. Do not drink alcohol or combine with other medications that have acetaminophen as an ingredient (Read the labels!).    Take valium for breakthrough pain, do not drink alcohol, drive, care for children or do other critical tasks while taking valium.  Concussion, Adult A concussion, or closed-head injury, is a brain injury caused by a direct blow to the head or by a quick and sudden movement (jolt) of the head or neck. Concussions are usually not life-threatening. Even so, the effects of a concussion can be serious. If you have had a concussion before, you are more likely to experience concussion-like symptoms after a direct blow to the head.  CAUSES   Direct blow to the head, such as from running into another player during a soccer game, being hit in a fight, or hitting your head on a hard surface.  A jolt of the head or neck that causes the brain to move back and forth inside the skull, such as in a car crash. SIGNS AND SYMPTOMS  The signs of a concussion can be hard to notice. Early on, they may be missed by you, family members, and health care providers. You may look fine but act or feel differently. Symptoms are usually temporary, but they may last for days, weeks, or even  longer. Some symptoms may appear right away while others may not show up for hours or days. Every head injury is different. Symptoms include:   Mild to moderate headaches that will not go away.  A feeling of pressure inside your head.  Having more trouble than usual:   Learning or remembering things you have heard.  Answering questions.  Paying attention or concentrating.   Organizing daily tasks.   Making decisions and solving problems.   Slowness in thinking, acting or reacting, speaking, or reading.   Getting lost or being easily confused.   Feeling tired all the time or lacking energy (fatigued).   Feeling drowsy.   Sleep disturbances.   Sleeping more than usual.   Sleeping less than usual.   Trouble falling asleep.   Trouble sleeping (insomnia).   Loss of balance or feeling lightheaded or dizzy.   Nausea or vomiting.   Numbness or tingling.   Increased sensitivity to:   Sounds.   Lights.   Distractions.   Vision problems or eyes that tire easily.   Diminished sense of taste or smell.   Ringing in the ears.   Mood changes such as feeling sad or anxious.   Becoming easily irritated or angry for little or no reason.   Lack of motivation.  Seeing or hearing things other people do not see or hear (hallucinations). DIAGNOSIS  Your health care provider can usually diagnose a  concussion based on a description of your injury and symptoms. He or she will ask whether you passed out (lost consciousness) and whether you are having trouble remembering events that happened right before and during your injury.  Your evaluation might include:   A brain scan to look for signs of injury to the brain. Even if the test shows no injury, you may still have a concussion.   Blood tests to be sure other problems are not present. TREATMENT   Concussions are usually treated in an emergency department, in urgent care, or at a clinic. You may  need to stay in the hospital overnight for further treatment.   Tell your health care provider if you are taking any medicines, including prescription medicines, over-the-counter medicines, and natural remedies. Some medicines, such as blood thinners (anticoagulants) and aspirin, may increase the chance of complications. Also tell your health care provider whether you have had alcohol or are taking illegal drugs. This information may affect treatment.  Your health care provider will send you home with important instructions to follow.  How fast you will recover from a concussion depends on many factors. These factors include how severe your concussion is, what part of your brain was injured, your age, and how healthy you were before the concussion.  Most people with mild injuries recover fully. Recovery can take time. In general, recovery is slower in older persons. Also, persons who have had a concussion in the past or have other medical problems may find that it takes longer to recover from their current injury. HOME CARE INSTRUCTIONS  General Instructions  Carefully follow the directions your health care provider gave you.  Only take over-the-counter or prescription medicines for pain, discomfort, or fever as directed by your health care provider.  Take only those medicines that your health care provider has approved.  Do not drink alcohol until your health care provider says you are well enough to do so. Alcohol and certain other drugs may slow your recovery and can put you at risk of further injury.  If it is harder than usual to remember things, write them down.  If you are easily distracted, try to do one thing at a time. For example, do not try to watch TV while fixing dinner.  Talk with family members or close friends when making important decisions.  Keep all follow-up appointments. Repeated evaluation of your symptoms is recommended for your recovery.  Watch your symptoms and  tell others to do the same. Complications sometimes occur after a concussion. Older adults with a brain injury may have a higher risk of serious complications such as of a blood clot on the brain.  Tell your teachers, school nurse, school counselor, coach, athletic trainer, or work Production designer, theatre/television/filmmanager about your injury, symptoms, and restrictions. Tell them about what you can or cannot do. They should watch for:   Increased problems with attention or concentration.   Increased difficulty remembering or learning new information.   Increased time needed to complete tasks or assignments.   Increased irritability or decreased ability to cope with stress.   Increased symptoms.   Rest. Rest helps the brain to heal. Make sure you:  Get plenty of sleep at night. Avoid staying up late at night.  Keep the same bedtime hours on weekends and weekdays.  Rest during the day. Take daytime naps or rest breaks when you feel tired.  Limit activities that require a lot of thought or concentration. These includes   Doing homework  or job-related work.   Watching TV.   Working on the computer.  Avoid any situation where there is potential for another head injury (football, hockey, soccer, basketball, martial arts, downhill snow sports and horseback riding). Your condition will get worse every time you experience a concussion. You should avoid these activities until you are evaluated by the appropriate follow-up caregivers. Returning To Your Regular Activities You will need to return to your normal activities slowly, not all at once. You must give your body and brain enough time for recovery.  Do not return to sports or other athletic activities until your health care provider tells you it is safe to do so.  Ask your health care provider when you can drive, ride a bicycle, or operate heavy machinery. Your ability to react may be slower after a brain injury. Never do these activities if you are  dizzy.  Ask your health care provider about when you can return to work or school. Preventing Another Concussion It is very important to avoid another brain injury, especially before you have recovered. In rare cases, another injury can lead to permanent brain damage, brain swelling, or death. The risk of this is greatest during the first 7 10 days after a head injury. Avoid injuries by:   Wearing a seat belt when riding in a car.   Drinking alcohol only in moderation.   Wearing a helmet when biking, skiing, skateboarding, skating, or doing similar activities.  Avoiding activities that could lead to a second concussion, such as contact or recreational sports, until your health care provider says it is OK.  Taking safety measures in your home.   Remove clutter and tripping hazards from floors and stairways.   Use grab bars in bathrooms and handrails by stairs.   Place non-slip mats on floors and in bathtubs.   Improve lighting in dim areas. SEEK MEDICAL CARE IF:   You have increased problems paying attention or concentrating.   You have increased difficulty remembering or learning new information.   You need more time to complete tasks or assignments than before.   You have increased irritability or decreased ability to cope with stress.  You have more symptoms than before. Seek medical care if you have any of the following symptoms for more than 2 weeks after your injury:   Lasting (chronic) headaches.   Dizziness or balance problems.   Nausea.  Vision problems.   Increased sensitivity to noise or light.   Depression or mood swings.   Anxiety or irritability.   Memory problems.   Difficulty concentrating or paying attention.   Sleep problems.   Feeling tired all the time. SEEK IMMEDIATE MEDICAL CARE IF:   You have severe or worsening headaches. These may be a sign of a blood clot in the brain.  You have weakness (even if only in one hand,  leg, or part of the face).  You have numbness.  You have decreased coordination.   You vomit repeatedly.  You have increased sleepiness.  One pupil is larger than the other.   You have convulsions.   You have slurred speech.   You have increased confusion. This may be a sign of a blood clot in the brain.  You have increased restlessness, agitation, or irritability.   You are unable to recognize people or places.   You have neck pain.   It is difficult to wake you up.   You have unusual behavior changes.   You lose consciousness. MAKE  SURE YOU:   Understand these instructions.  Will watch your condition.  Will get help right away if you are not doing well or get worse. Document Released: 03/28/2003 Document Revised: 09/07/2012 Document Reviewed: 07/28/2012 [facility] Patient Information 2014 [place], [place].

## 2013-06-03 NOTE — ED Notes (Signed)
Pt conitues to actively vomit. PA notified.

## 2013-06-03 NOTE — ED Notes (Signed)
No obvious injury noted to back of head or neck. Pt has mild swelling noted to posterior head.

## 2013-06-03 NOTE — ED Notes (Signed)
Pt states she feels nausea

## 2013-06-03 NOTE — ED Notes (Signed)
Pt returned from CT. During transport back from CT, pt vomited on the floor. At this time pt denies nausea, or dizziness. AAOX4 in NAD

## 2013-06-03 NOTE — ED Notes (Signed)
C collar removed by Christa SeeNicole P, PA

## 2013-06-03 NOTE — ED Notes (Signed)
Attempted to ambulate pt. Pt dizzy and vomiting upon sitting up. PA came into room and told this NT to abort ambulating pt.

## 2013-06-03 NOTE — ED Provider Notes (Signed)
CSN: [id number]     Arrival date & time 06/03/13  1057 History   First MD Initiated Contact with Patient 06/03/13 1059     Chief Complaint  Patient presents with  . Fall     (Consider location/radiation/quality/duration/timing/severity/associated sxs/prior Treatment) HPI  Aimee Lynch is a 33 y.o. female complaining of loss of consciousness, back pain, right shoulder pain status post fall 15 feet off a deck onto a concrete slab. Patient has occipital headache rated 5/10, denies change in vision, nausea vomiting she has mild cervicalgia, C-spine immobilized, and denies chest pain, abdominal pain, difficulty moving major joints, she states her assist shortness to the right shoulder. Denies numbness, weakness, anticoagulation. States last menstrual period was last week.  Past Medical History  Diagnosis Date  . No pertinent past medical history   . Heart murmur   . Anemia     BORDERLINE DURING PREGNANCY   Past Surgical History  Procedure Laterality Date  . No past surgeries    . Cesarean section  03/05/2011    Procedure: CESAREAN SECTION;  Surgeon: Purcell Nails, MD;  Location: [facility];  Service: Gynecology;  Laterality: N/A;  Primary   Family History  Problem Relation Age of Onset  . Nephrolithiasis Brother   . Heart disease Maternal Uncle     heart surgery  . Tuberculosis Maternal Grandmother    History  Substance Use Topics  . Smoking status: Never Smoker   . Smokeless tobacco: Never Used  . Alcohol Use: No   OB History   Grav Para Term Preterm Abortions TAB SAB Ect Mult Living   1 1 0 1 0 0 0 0 0 1      Review of Systems  10 systems reviewed and found to be negative, except as noted in the HPI.  Allergies  Hydrocodone  Home Medications   Prior to Admission medications   Medication Sig Start Date End Date Taking? Authorizing Provider  acetaminophen (TYLENOL) 500 MG tablet Take 500 mg by mouth every 6 (six) hours as needed for moderate pain.   Yes Historical  Provider, MD  ibuprofen (ADVIL,MOTRIN) 200 MG tablet Take 200 mg by mouth every 6 (six) hours as needed.   Yes Historical Provider, MD   BP 115/52  Pulse 76  Temp(Src) 98.2 F (36.8 C) (Oral)  Resp 19  Ht 5\' 2"  (1.575 m)  Wt 175 lb (79.379 kg)  BMI 32.00 kg/m2  SpO2 100%  LMP 05/31/2013 Physical Exam  Nursing note and vitals reviewed. Constitutional: She is oriented to person, place, and time. She appears well-developed and well-nourished. No distress.  HENT:  Head: Normocephalic and atraumatic.  Mouth/Throat: Oropharynx is clear and moist.  No abrasions or contusions.   No hemotympanum, battle signs or raccoon's eyes  No crepitance or tenderness to palpation along the orbital rim.  EOMI intact with no pain or diplopia  No abnormal otorrhea or rhinorrhea. Nasal septum midline.  No intraoral trauma.      Eyes: Conjunctivae and EOM are normal. Pupils are equal, round, and reactive to light.  Neck:  Rigid C-spine collar in place, no midline tenderness to palpation or step-offs.  Cardiovascular: Normal rate, regular rhythm and intact distal pulses.   Pulmonary/Chest: Effort normal and breath sounds normal. No stridor. No respiratory distress. She has no wheezes. She has no rales. She exhibits no tenderness.  Abdominal: Soft. Bowel sounds are normal. She exhibits no distension and no mass. There is no tenderness. There is no rebound and no guarding.  Musculoskeletal: Normal range of motion. She exhibits no edema and no tenderness.  Good ROM to all major joints, neurovascularly intact  Neurological: She is alert and oriented to person, place, and time.  Skin:  No bruises, ecchymoses, abrasions or lacerations  Psychiatric: She has a normal mood and affect.    ED Course  Procedures (including critical care time) Labs Review Labs Reviewed  HCG, SERUM, QUALITATIVE    Imaging Review Dg Chest 1 View  06/03/2013   CLINICAL DATA:  Status post fall  EXAM: CHEST - 1 VIEW   COMPARISON:  None  FINDINGS: The heart size and mediastinal contours are within normal limits. Both lungs are clear. The visualized skeletal structures are unremarkable.  IMPRESSION: No active disease.   Electronically Signed   By: Signa Kell M.D.   On: 06/03/2013 13:42   Dg Thoracic Spine W/swimmers  06/03/2013   CLINICAL DATA:  Fall  EXAM: THORACIC SPINE - 2 VIEW + SWIMMERS  COMPARISON:  None  FINDINGS: Normal alignment of the thoracic spine. The vertebral body heights appear well preserved. Mild multi level disc space narrowing and ventral endplate spurring is identified compatible with degenerative disc disease. No thoracic spine fracture dated  IMPRESSION: 1. No acute findings. 2. Degenerative disc disease.   Electronically Signed   By: Signa Kell M.D.   On: 06/03/2013 13:38   Dg Lumbar Spine Complete  06/03/2013   CLINICAL DATA:  Fall.  EXAM: LUMBAR SPINE - COMPLETE 4+ VIEW  COMPARISON:  None.  FINDINGS: There is no evidence of lumbar spine fracture. Alignment is normal. Mild multi level disc space narrowing and ventral endplate spurring is noted.  IMPRESSION: 1. No acute findings. 2. Mild lumbar spondylosis.   Electronically Signed   By: Signa Kell M.D.   On: 06/03/2013 13:42   Dg Shoulder Right  06/03/2013   CLINICAL DATA:  Fall.  EXAM: RIGHT SHOULDER - 2+ VIEW  COMPARISON:  None  FINDINGS: There is no evidence of fracture or dislocation. There is no evidence of arthropathy or other focal bone abnormality. Soft tissues are unremarkable.  IMPRESSION: Negative.   Electronically Signed   By: Signa Kell M.D.   On: 06/03/2013 13:43   Ct Head Wo Contrast  06/03/2013   CLINICAL DATA:  Fall with head and neck pain.  EXAM: CT HEAD WITHOUT CONTRAST  CT CERVICAL SPINE WITHOUT CONTRAST  TECHNIQUE: Multidetector CT imaging of the head and cervical spine was performed following the standard protocol without intravenous contrast. Multiplanar CT image reconstructions of the cervical spine were  also generated.  COMPARISON:  None.  FINDINGS: CT HEAD FINDINGS  Skull and Sinuses:Negative for fracture. Opacification of posterior air cells in the left mastoid. No sclerosis or trabecular loss.  Orbits: No acute abnormality.  Brain: No evidence of acute abnormality, such as acute infarction, hemorrhage, hydrocephalus, or mass lesion/mass effect. There is calcification of the choroid plexus which accounts for punctate high-density just above the foramen of [place].  CT CERVICAL SPINE FINDINGS  Negative for acute fracture or subluxation. No prevertebral edema. No gross cervical canal hematoma. No significant osseous canal or foraminal stenosis.  IMPRESSION: No acute intracranial or cervical spine injury.   Electronically Signed   By: Tiburcio Pea M.D.   On: 06/03/2013 12:28   Ct Cervical Spine Wo Contrast  06/03/2013   CLINICAL DATA:  Fall with head and neck pain.  EXAM: CT HEAD WITHOUT CONTRAST  CT CERVICAL SPINE WITHOUT CONTRAST  TECHNIQUE: Multidetector CT imaging of  the head and cervical spine was performed following the standard protocol without intravenous contrast. Multiplanar CT image reconstructions of the cervical spine were also generated.  COMPARISON:  None.  FINDINGS: CT HEAD FINDINGS  Skull and Sinuses:Negative for fracture. Opacification of posterior air cells in the left mastoid. No sclerosis or trabecular loss.  Orbits: No acute abnormality.  Brain: No evidence of acute abnormality, such as acute infarction, hemorrhage, hydrocephalus, or mass lesion/mass effect. There is calcification of the choroid plexus which accounts for punctate high-density just above the foramen of [place].  CT CERVICAL SPINE FINDINGS  Negative for acute fracture or subluxation. No prevertebral edema. No gross cervical canal hematoma. No significant osseous canal or foraminal stenosis.  IMPRESSION: No acute intracranial or cervical spine injury.   Electronically Signed   By: Tiburcio PeaJonathan  Watts M.D.   On: 06/03/2013 12:28      EKG Interpretation None      MDM   Final diagnoses:  Fall at home  Concussion    Filed Vitals:   06/03/13 1521 06/03/13 1530 06/03/13 1631 06/03/13 1708  BP: 113/56 101/52 103/54 115/52  Pulse: 91 70 103 76  Temp:      TempSrc:      Resp: 20   19  Height:      Weight:      SpO2: 97% 98% 99% 100%    Medications  morphine 4 MG/ML injection 4 mg (4 mg Intravenous Given 06/03/13 1151)  ondansetron (ZOFRAN) injection 4 mg (4 mg Intravenous Given 06/03/13 1151)  ondansetron (ZOFRAN) injection 4 mg (4 mg Intravenous Given 06/03/13 1346)  ketorolac (TORADOL) 15 MG/ML injection 15 mg (15 mg Intravenous Given 06/03/13 1348)  LORazepam (ATIVAN) injection 1 mg (1 mg Intravenous Given 06/03/13 1420)  meclizine (ANTIVERT) tablet 50 mg (50 mg Oral Given 06/03/13 1446)  metoCLOPramide (REGLAN) injection 10 mg (10 mg Intravenous Given 06/03/13 1525)    Aimee Lynch is a 33 y.o. female presenting with a fall from height of 15 feet with head trauma and loss of consciousness. Head CT, cervical spine CT and plain films of the lower spine and shoulder are all negative. Patient's pain is well controlled in the ED. Patient with multiple episodes of emesis and vertiginous sensation. This may be secondary to the pain medication that she has received. After several doses of various antiemetics she is able to ambulate, tolerating by mouth. Differential include postconcussive syndrome versus nausea secondary to narcotic administration. I have given her discharge instructions for concussion and advised her to follow closely with both primary care and neurology. Also advised her to have a very low threshold to return to ED. Patient seemed reliable for followup.  Discussed case with attending MD who agrees with plan and stability to d/c to home.   Evaluation does not show pathology that would require ongoing emergent intervention or inpatient treatment. Pt is hemodynamically stable and mentating appropriately.  Discussed findings and plan with patient/guardian, who agrees with care plan. All questions answered. Return precautions discussed and outpatient follow up given.   Discharge Medication List as of 06/03/2013  4:18 PM    START taking these medications   Details  diazepam (VALIUM) 5 MG tablet Take 1 tablet (5 mg total) by mouth every 6 (six) hours as needed for anxiety (spasms)., Starting 06/03/2013, Until Discontinued, Print    meclizine (ANTIVERT) 50 MG tablet Take 1 tablet (50 mg total) by mouth 3 (three) times daily as needed., Starting 06/03/2013, Until Discontinued, Print    ondansetron (  ZOFRAN) 4 MG tablet Take 1 tablet (4 mg total) by mouth every 6 (six) hours as needed for nausea or vomiting., Starting 06/03/2013, Until Discontinued, Print    oxyCODONE-acetaminophen (PERCOCET/ROXICET) 5-325 MG per tablet 1 to 2 tabs PO q6hrs  PRN for pain, Print        Note: Portions of this report may have been transcribed using voice recognition software. Every effort was made to ensure accuracy; however, inadvertent computerized transcription errors may be present     Aimee Emeryicole Sharrod Achille, PA-C 06/04/13 [phone]

## 2013-06-03 NOTE — ED Notes (Signed)
Per ems, pt was working on the deck, lost her balance and fell backwards onto a concrete slab about 4914ft down. Pt remembers fall, but thinks shortly after she lost consciousness. C/o head, neck, and R shoulder pain. Full ROM. Pt is in NAD, AAOX4.

## 2013-06-03 NOTE — ED Notes (Signed)
Pt actively vomiting.

## 2013-06-03 NOTE — ED Notes (Signed)
Husband - Jerrye NobleJohn R Caponi, [phone]

## 2013-06-04 NOTE — ED Provider Notes (Signed)
Medical screening examination/treatment/procedure(s) were performed by non-physician practitioner and as supervising physician I was immediately available for consultation/collaboration.   EKG Interpretation None        Layla MawKristen N Ward, DO 06/04/13 1541

## 2013-06-06 ENCOUNTER — Ambulatory Visit (INDEPENDENT_AMBULATORY_CARE_PROVIDER_SITE_OTHER): Payer: Managed Care, Other (non HMO) | Admitting: Diagnostic Neuroimaging

## 2013-06-06 ENCOUNTER — Encounter: Payer: Self-pay | Admitting: Diagnostic Neuroimaging

## 2013-06-06 VITALS — BP 127/79 | HR 55 | Temp 98.1°F | Ht 62.0 in | Wt 176.0 lb

## 2013-06-06 DIAGNOSIS — S060X9A Concussion with loss of consciousness of unspecified duration, initial encounter: Secondary | ICD-10-CM

## 2013-06-06 DIAGNOSIS — R42 Dizziness and giddiness: Secondary | ICD-10-CM

## 2013-06-06 NOTE — Patient Instructions (Signed)
Continue rest, medications, and gradually increase activities as tolerated.

## 2013-06-06 NOTE — Progress Notes (Signed)
[facility]  PATIENT: Aimee Lynch DOB: May 10, 1980  REFERRING CLINICIAN: ER HISTORY FROM: patient  REASON FOR VISIT: new consult   HISTORICAL  CHIEF COMPLAINT:  Chief Complaint  Patient presents with  . Fall    Larey SeatFell off 14' deck  . Concussion    HISTORY OF PRESENT ILLNESS:   33 year old female here for evaluation of concussion.  Saturday, 06/03/2013, patient was working on building and elevated deck behind her home. She slipped off the edge and fell 14 feet down onto a concrete slab. Patient loss consciousness for one to 5 minutes. She laid him mobilized until EMT arrived, who were called by patient's mother who was with her at the time of the accident. Patient was taken to the hospital and had x-ray, CT scan name, pain medication. Patient had some intermittent vertigo and nausea during the emergency room evaluation. It was unclear if this was related to medication side effect or concussion symptoms. Patient gradually was able to ambulate and discharged home later that evening.  Since that time patient's pain has eased up significantly. Still having some mild intermittent vertigo when she lays down. Overall symptoms are gradually improving.  REVIEW OF SYSTEMS: Full 14 system review of systems performed and notable only for dizziness spinning sensation.  ALLERGIES: Allergies  Allergen Reactions  . Hydrocodone Nausea And Vomiting    HOME MEDICATIONS: Outpatient Prescriptions Prior to Visit  Medication Sig Dispense Refill  . acetaminophen (TYLENOL) 500 MG tablet Take 500 mg by mouth every 6 (six) hours as needed for moderate pain.      . diazepam (VALIUM) 5 MG tablet Take 1 tablet (5 mg total) by mouth every 6 (six) hours as needed for anxiety (spasms).  10 tablet  0  . ibuprofen (ADVIL,MOTRIN) 200 MG tablet Take 200 mg by mouth every 6 (six) hours as needed.      . meclizine (ANTIVERT) 50 MG tablet Take 1 tablet (50 mg total) by mouth 3 (three) times daily  as needed.  30 tablet  0  . ondansetron (ZOFRAN) 4 MG tablet Take 1 tablet (4 mg total) by mouth every 6 (six) hours as needed for nausea or vomiting.  10 tablet  0  . oxyCODONE-acetaminophen (PERCOCET/ROXICET) 5-325 MG per tablet 1 to 2 tabs PO q6hrs  PRN for pain  15 tablet  0   No facility-administered medications prior to visit.    PAST MEDICAL HISTORY: Past Medical History  Diagnosis Date  . No pertinent past medical history   . Heart murmur   . Anemia     BORDERLINE DURING PREGNANCY    PAST SURGICAL HISTORY: Past Surgical History  Procedure Laterality Date  . No past surgeries    . Cesarean section  03/05/2011    Procedure: CESAREAN SECTION;  Surgeon: Aimee NailsAngela Y Roberts, MD;  Location: [facility];  Service: Gynecology;  Laterality: N/A;  Primary    FAMILY HISTORY: Family History  Problem Relation Age of Onset  . Nephrolithiasis Brother   . Heart disease Maternal Uncle     heart surgery  . Tuberculosis Maternal Grandmother     SOCIAL HISTORY:  History   Social History  . Marital Status: Married    Spouse Name: Aimee Lynch    Number of Children: 1  . Years of Education: College   Occupational History  .  Other    [employer]   Social History Main Topics  . Smoking status: Never Smoker   . Smokeless tobacco: Never Used  .  Alcohol Use: No  . Drug Use: No  . Sexual Activity: Yes    Birth Control/ Protection: Condom   Other Topics Concern  . Not on file   Social History Narrative   Patient lives at home with family.   Caffeine Use: 1 cup of tea (5-7cups weekly)     PHYSICAL EXAM  Filed Vitals:   06/06/13 0939  BP: 127/79  Pulse: 55  Temp: 98.1 F (36.7 C)  TempSrc: Oral  Height: 5\' 2"  (1.575 m)  Weight: 176 lb (79.833 kg)    Not recorded    Body mass index is 32.18 kg/(m^2).  GENERAL EXAM: Patient is in no distress; well developed, nourished and groomed; neck is supple  CARDIOVASCULAR: Regular rate and rhythm, no murmurs, no carotid  bruits  NEUROLOGIC: MENTAL STATUS: awake, alert, oriented to person, place and time, recent and remote memory intact, normal attention and concentration, language fluent, comprehension intact, naming intact, fund of knowledge appropriate CRANIAL NERVE: no papilledema on fundoscopic exam, pupils equal and reactive to light, visual fields full to confrontation, extraocular muscles intact, no nystagmus, facial sensation and strength symmetric, hearing intact, palate elevates symmetrically, uvula midline, shoulder shrug symmetric, tongue midline. MOTOR: normal bulk and tone, full strength in the BUE, BLE SENSORY: normal and symmetric to light touch, pinprick, temperature, vibration COORDINATION: finger-nose-finger, fine finger movements normal REFLEXES: deep tendon reflexes present and symmetric GAIT/STATION: narrow based gait; able to walk on toes, heels and tandem; romberg is negative    DIAGNOSTIC DATA (LABS, IMAGING, TESTING) - I reviewed patient records, labs, notes, testing and imaging myself where available.  Lab Results  Component Value Date   WBC 15.9* 03/06/2011   HGB 9.0* 03/06/2011   HCT 26.7* 03/06/2011   MCV 100.4* 03/06/2011   PLT 248 03/06/2011   No results found for this basename: na, k, cl, co2, glucose, bun, creatinine, calcium, prot, albumin, ast, alt, alkphos, bilitot, gfrnonaa, gfraa   No results found for this basename: CHOL, HDL, LDLCALC, LDLDIRECT, TRIG, CHOLHDL   No results found for this basename: HGBA1C   No results found for this basename: VITAMINB12   No results found for this basename: TSH   I reviewed images myself and agree with interpretation. -VRP  06/03/13 CT head / cervical spine - No acute intracranial or cervical spine injury.   ASSESSMENT AND PLAN  33 y.o. year old female here with fall off deck on to concrete, 14 foot drop, with LOC x 5 minutes and subsequent pain and vertigo. Consistent with concussion. Slow, but steadily improving. Hopefully  she should make a complete recovery.  PLAN: - rest, medication, gradually increase activity as tolerated - she will establish with local PCP for further care  Return if symptoms worsen or fail to improve, for return to PCP.    Suanne MarkerVIKRAM R. PENUMALLI, MD 06/06/2013, 10:54 AM Certified in Neurology, Neurophysiology and Neuroimaging  [facility] [address], [address] [place], [place] [address] [phone]

## 2013-11-20 ENCOUNTER — Encounter: Payer: Self-pay | Admitting: Diagnostic Neuroimaging

## 2014-08-31 IMAGING — CT CT HEAD W/O CM
3 of 5 series · 14 of 47 positions shown, 16 images · non-contrast
Comparison: None.

CLINICAL DATA: Fall with head and neck pain.

EXAM:
CT HEAD WITHOUT CONTRAST
CT CERVICAL SPINE WITHOUT CONTRAST
TECHNIQUE: Multidetector CT imaging of the head and cervical spine was
performed following the standard protocol without intravenous
contrast. Multiplanar CT image reconstructions of the cervical spine
were also generated.

[Series 7: coronals · coronal · 0.26mm/px · 3 of 39 slices shown]
[im 13/39  brain]
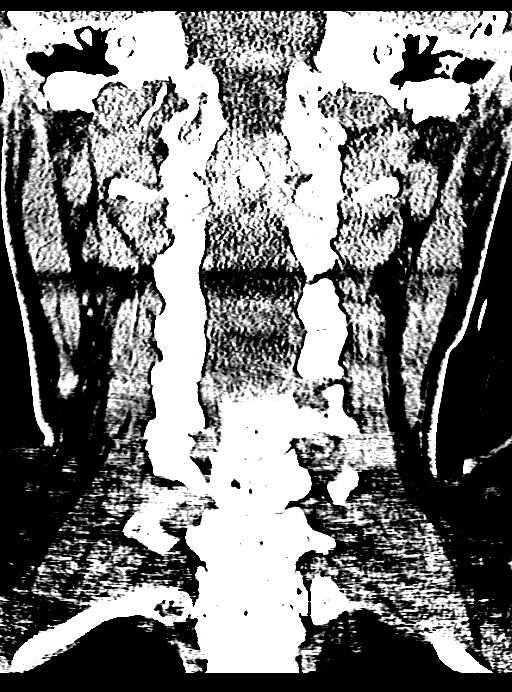
[im 17/39  brain]
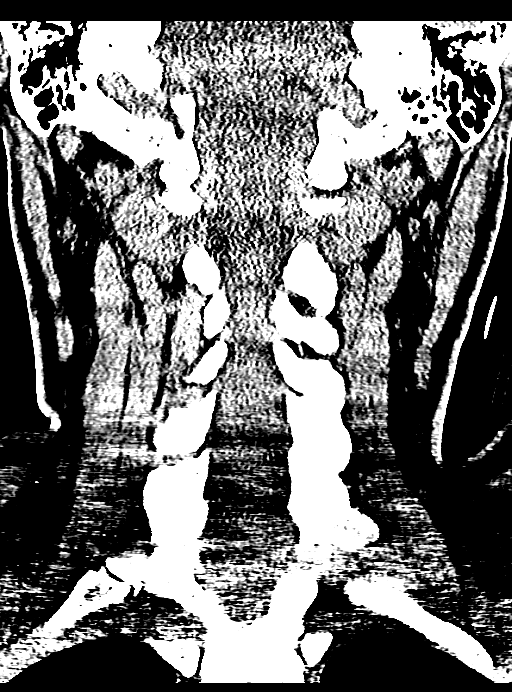
[im 22/39  brain]
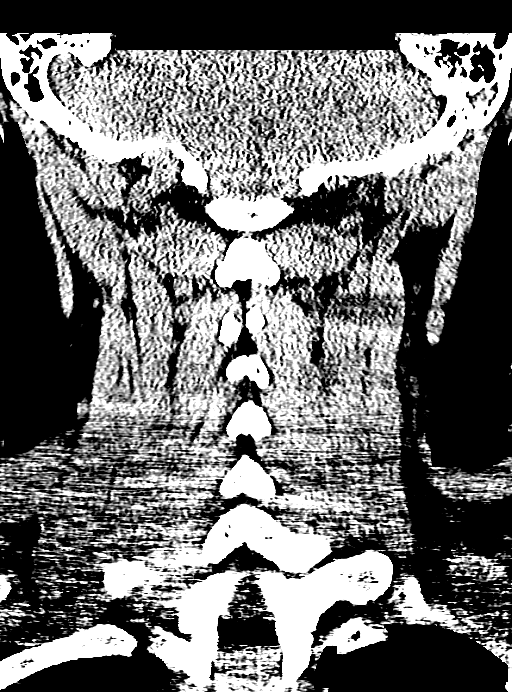

[Series 8: sagittals · sagittal · 0.30mm/px · 3 of 43 slices shown]
[im 15/43  brain]
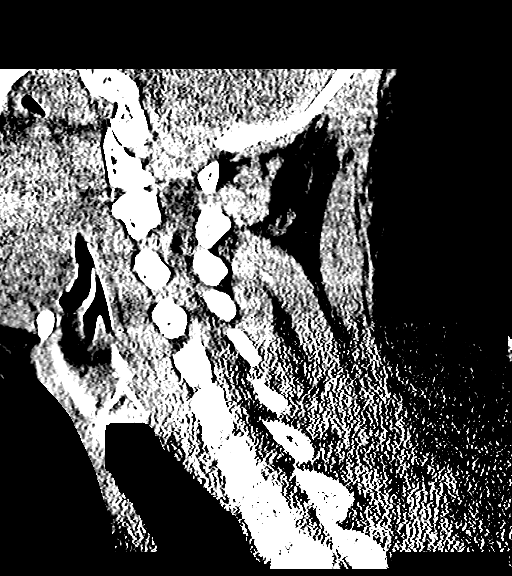
[im 22/43  brain]
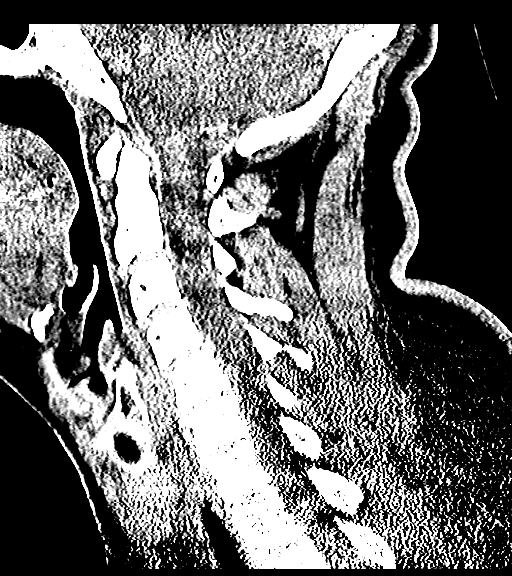
[im 29/43  brain]
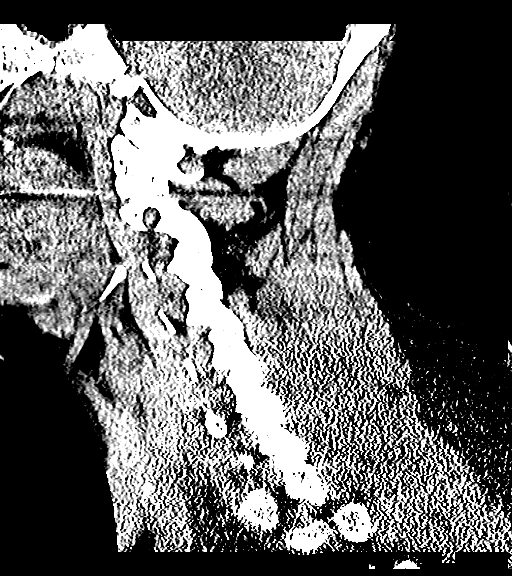

[Series 9: orthogonals · axial · 0.32mm/px · z∈[+246,+380]mm · 8 of 88 slices shown, 10 images]
[im 8/88  brain]
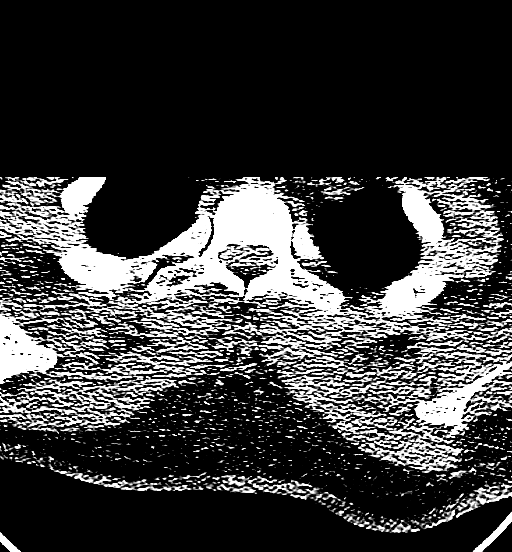
[im 8/88  bone]
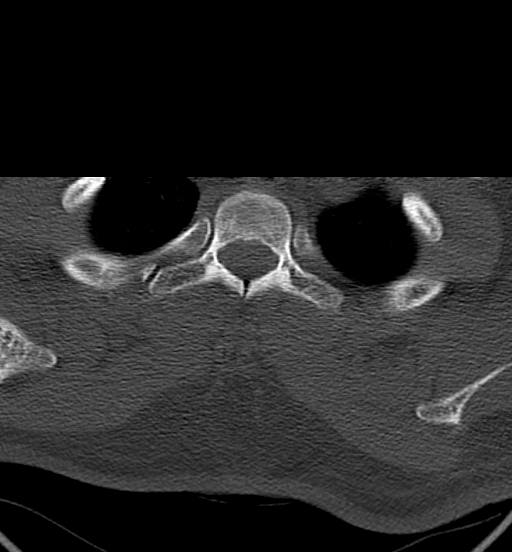
[im 22/88  brain]
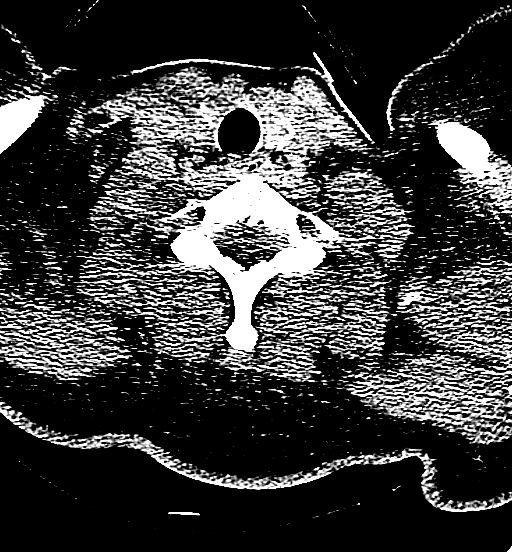
[im 30/88  brain]
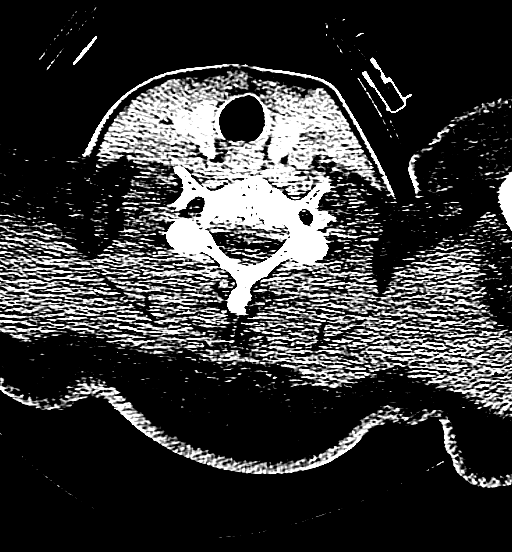
[im 37/88  brain]
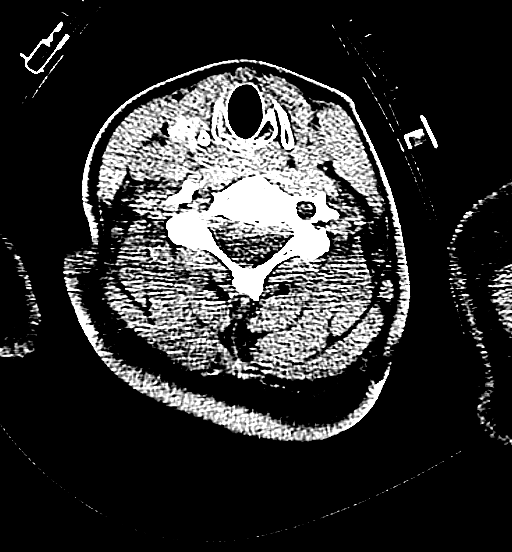
[im 51/88  brain]
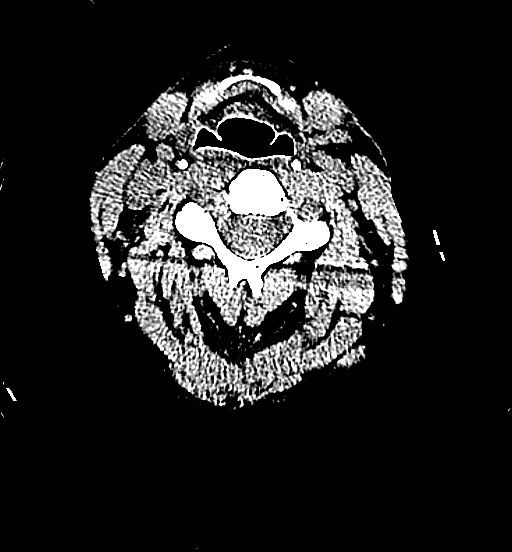
[im 51/88  bone]
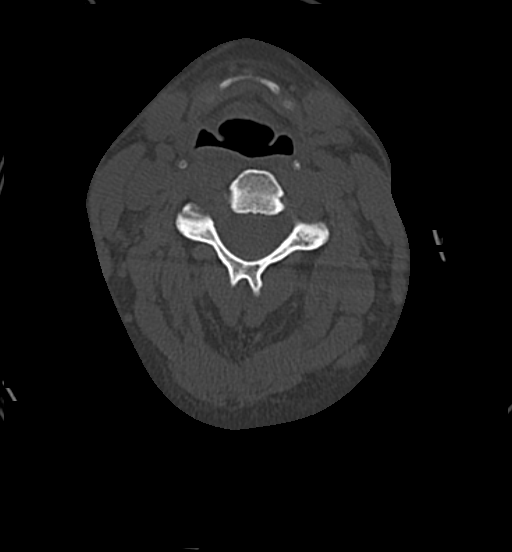
[im 59/88  brain]
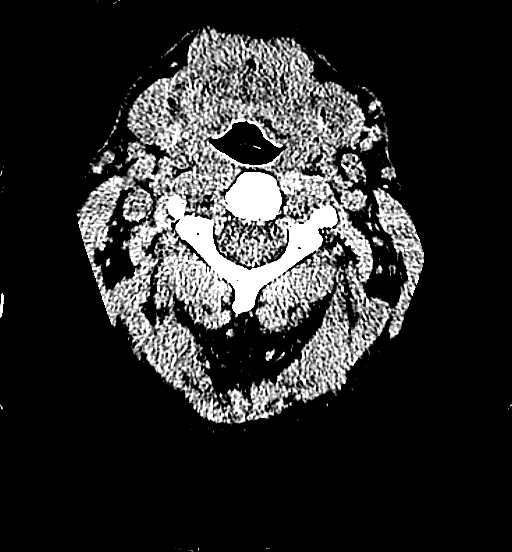
[im 66/88  brain]
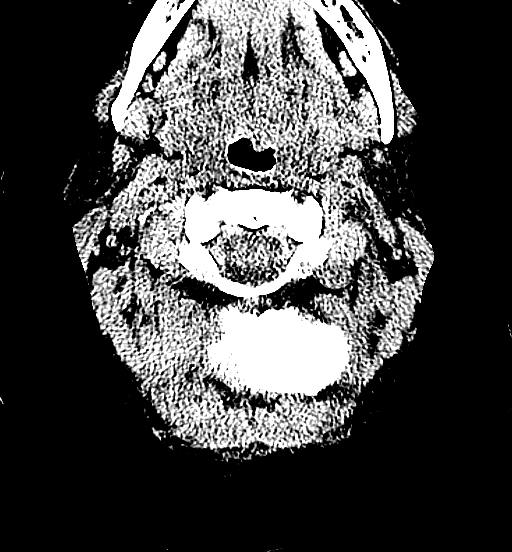
[im 80/88  brain]
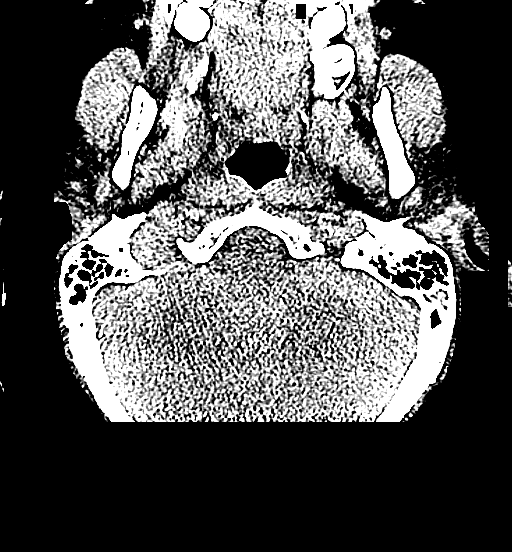

[14 of 47 positions shown; findings below may reference images not displayed]

FINDINGS: CT HEAD FINDINGS

Skull and Sinuses:Negative for fracture. Opacification of posterior
air cells in the left mastoid. No sclerosis or trabecular loss.

Orbits: No acute abnormality.

Brain: No evidence of acute abnormality, such as acute infarction,
hemorrhage, hydrocephalus, or mass lesion/mass effect. There is
calcification of the choroid plexus which accounts for punctate
high-density just above the foramen of [place].

CT CERVICAL SPINE FINDINGS

Negative for acute fracture or subluxation. No prevertebral edema.
No gross cervical canal hematoma. No significant osseous canal or
foraminal stenosis.
IMPRESSION: No acute intracranial or cervical spine injury.

## 2021-05-09 DIAGNOSIS — G8929 Other chronic pain: Secondary | ICD-10-CM | POA: Diagnosis not present

## 2021-05-09 DIAGNOSIS — M545 Low back pain, unspecified: Secondary | ICD-10-CM | POA: Diagnosis not present

## 2021-05-30 DIAGNOSIS — Z0000 Encounter for general adult medical examination without abnormal findings: Secondary | ICD-10-CM | POA: Diagnosis not present

## 2021-06-04 DIAGNOSIS — Z6832 Body mass index (BMI) 32.0-32.9, adult: Secondary | ICD-10-CM | POA: Diagnosis not present

## 2021-06-04 DIAGNOSIS — Z1231 Encounter for screening mammogram for malignant neoplasm of breast: Secondary | ICD-10-CM | POA: Diagnosis not present

## 2021-06-04 DIAGNOSIS — E669 Obesity, unspecified: Secondary | ICD-10-CM | POA: Diagnosis not present

## 2021-06-04 DIAGNOSIS — Z0000 Encounter for general adult medical examination without abnormal findings: Secondary | ICD-10-CM | POA: Diagnosis not present

## 2021-06-04 DIAGNOSIS — N390 Urinary tract infection, site not specified: Secondary | ICD-10-CM | POA: Diagnosis not present

## 2021-06-04 DIAGNOSIS — E7801 Familial hypercholesterolemia: Secondary | ICD-10-CM | POA: Diagnosis not present

## 2021-06-04 DIAGNOSIS — Z124 Encounter for screening for malignant neoplasm of cervix: Secondary | ICD-10-CM | POA: Diagnosis not present

## 2021-07-03 DIAGNOSIS — Z1231 Encounter for screening mammogram for malignant neoplasm of breast: Secondary | ICD-10-CM | POA: Diagnosis not present

## 2021-07-03 LAB — HM MAMMOGRAPHY

## 2021-08-01 DIAGNOSIS — M5127 Other intervertebral disc displacement, lumbosacral region: Secondary | ICD-10-CM | POA: Diagnosis not present

## 2021-08-04 DIAGNOSIS — G8929 Other chronic pain: Secondary | ICD-10-CM | POA: Diagnosis not present

## 2021-08-04 DIAGNOSIS — M545 Low back pain, unspecified: Secondary | ICD-10-CM | POA: Diagnosis not present

## 2021-08-04 DIAGNOSIS — M5136 Other intervertebral disc degeneration, lumbar region: Secondary | ICD-10-CM | POA: Diagnosis not present

## 2021-08-11 DIAGNOSIS — M5126 Other intervertebral disc displacement, lumbar region: Secondary | ICD-10-CM | POA: Diagnosis not present

## 2021-08-11 DIAGNOSIS — M5136 Other intervertebral disc degeneration, lumbar region: Secondary | ICD-10-CM | POA: Diagnosis not present

## 2021-09-26 DIAGNOSIS — M5136 Other intervertebral disc degeneration, lumbar region: Secondary | ICD-10-CM | POA: Diagnosis not present

## 2021-09-26 DIAGNOSIS — M5126 Other intervertebral disc displacement, lumbar region: Secondary | ICD-10-CM | POA: Diagnosis not present

## 2022-07-06 DIAGNOSIS — Z1231 Encounter for screening mammogram for malignant neoplasm of breast: Secondary | ICD-10-CM | POA: Diagnosis not present

## 2022-10-13 ENCOUNTER — Ambulatory Visit: Payer: BC Managed Care – PPO | Admitting: Family Medicine

## 2022-10-13 ENCOUNTER — Encounter: Payer: Self-pay | Admitting: Family Medicine

## 2022-10-13 VITALS — BP 132/80 | HR 70 | Temp 98.2°F | Ht 62.5 in | Wt 196.5 lb

## 2022-10-13 DIAGNOSIS — Z6835 Body mass index (BMI) 35.0-35.9, adult: Secondary | ICD-10-CM

## 2022-10-13 DIAGNOSIS — E782 Mixed hyperlipidemia: Secondary | ICD-10-CM

## 2022-10-13 DIAGNOSIS — M5127 Other intervertebral disc displacement, lumbosacral region: Secondary | ICD-10-CM | POA: Diagnosis not present

## 2022-10-13 DIAGNOSIS — Z23 Encounter for immunization: Secondary | ICD-10-CM

## 2022-10-13 LAB — COMPREHENSIVE METABOLIC PANEL
ALT: 16 U/L (ref 0–35)
AST: 15 U/L (ref 0–37)
Albumin: 4.2 g/dL (ref 3.5–5.2)
Alkaline Phosphatase: 46 U/L (ref 39–117)
BUN: 17 mg/dL (ref 6–23)
CO2: 22 mEq/L (ref 19–32)
Calcium: 9.5 mg/dL (ref 8.4–10.5)
Chloride: 106 mEq/L (ref 96–112)
Creatinine, Ser: 0.69 mg/dL (ref 0.40–1.20)
GFR: 105.46 mL/min (ref >60.00)
Glucose, Bld: 97 mg/dL (ref 70–99)
Potassium: 4 mEq/L (ref 3.5–5.1)
Sodium: 137 mEq/L (ref 135–145)
Total Bilirubin: 0.3 mg/dL (ref 0.2–1.2)
Total Protein: 7.8 g/dL (ref 6.0–8.3)

## 2022-10-13 LAB — LIPID PANEL
Cholesterol: 287 mg/dL — ABNORMAL HIGH (ref 0–200)
HDL: 52.6 mg/dL (ref >39.00)
LDL Cholesterol: 217 mg/dL — ABNORMAL HIGH (ref 0–99)
NonHDL: 234.48
Total CHOL/HDL Ratio: 5
Triglycerides: 89 mg/dL (ref 0.0–149.0)
VLDL: 17.8 mg/dL (ref 0.0–40.0)

## 2022-10-13 LAB — HEMOGLOBIN A1C: Hgb A1c MFr Bld: 5.5 % (ref 4.6–6.5)

## 2022-10-13 LAB — TSH: TSH: 3.18 u[IU]/mL (ref 0.35–5.50)

## 2022-10-13 MED ORDER — ZEPBOUND 5 MG/0.5ML ~~LOC~~ SOAJ: 5.0000 mg | SUBCUTANEOUS | 0 refills | Status: DC

## 2022-10-13 MED ORDER — ZEPBOUND 2.5 MG/0.5ML ~~LOC~~ SOAJ: 2.5000 mg | SUBCUTANEOUS | 0 refills | Status: DC

## 2022-10-13 MED ORDER — ZEPBOUND 7.5 MG/0.5ML ~~LOC~~ SOAJ: 7.5000 mg | SUBCUTANEOUS | 0 refills | Status: DC

## 2022-10-13 NOTE — Assessment & Plan Note (Signed)
Last LDL was >200, will check new lipid panel today. Am starting pt on Zepbound for weight loss which should improve her cholesterol as well. RTC in 3 months

## 2022-10-13 NOTE — Assessment & Plan Note (Addendum)
I have had an extensive 30 minute conversation today with the patient about healthy eating habits, exercise, calorie and carb goals for sustainable and successful weight loss. I gave the patient caloric and protein daily intake values as well as described the importance of increasing fiber and water intake. I discussed weight loss medications that could be used in the treatment of this patient. Handouts on low carb eating were given to the patient.    Total calories: 1600- 1800 per day  Total Protein per day: 95 grams per day  Total carbs: less than 100 grams per day  Co morbid condition is hyperlipidemia, failed diet and exercise multiple times over the last few years

## 2022-10-13 NOTE — Progress Notes (Signed)
New Patient Office Visit  Subjective    Patient ID: Aimee Lynch, female    DOB: May 18, 1980  Age: 42 y.o. MRN: [id number]  CC:  Chief Complaint  Patient presents with   Establish Care    HPI Aimee Lynch presents to establish care Patient was seeing a previous PCP at [facility]. States that she has been struggling with her weight since the last 11 years since she had her daughter. States that she has tried multiple times to lose weight over the years. She reports that she was also on keto for about 10 months, states that she only lost about 10-15 pounds. Has been walking daily to get in her exercise.  Patient is concerned because she has elevated cholesterol as well on her last set of labs. LDL last year was >200. She reports also that her brother and mother are also on cholesterol medication.   Pt reports more frequent hot flashes in the last year or so. She states that she has been waking up at night feeling very hot, even sweating, which is unusual for her. Is currently taking seasonale so she only gets periods 3 times a year.   Pt also has a L5-S1 discu herniation seen on MRI. States that she met with the neurosurgeon and didn't  think that surgery was going to be beneficial. States she has been to PT and it seems to be under control, occasionally will get back pain but it only lasts for 1-2 days.   Current Outpatient Medications  Medication Instructions   ibuprofen (ADVIL) 200 mg, Every 6 hours PRN   levonorgestrel-ethinyl estradiol (SEASONALE) 0.15-0.03 MG tablet 1 tablet, Oral, Daily   Multiple Vitamins-Minerals (MULTIVITAMIN ADULTS PO) Oral, Daily   naproxen sodium (ALEVE) 220 mg, Oral, As needed   Omega-3 Fatty Acids (OMEGA 3 PO) 1,250 mg, Oral, Daily   OVER THE COUNTER MEDICATION D-mannase with cranberry and dandelion-once a day   Probiotic Product (PROBIOTIC PO) Oral, Daily   Zepbound 2.5 mg, Subcutaneous, Weekly   Zepbound 5 mg, Subcutaneous, Weekly   Zepbound 7.5 mg,  Subcutaneous, Weekly    Past Medical History:  Diagnosis Date   Anemia    BORDERLINE DURING PREGNANCY   Heart murmur    No pertinent past medical history     Past Surgical History:  Procedure Laterality Date   CESAREAN SECTION  03/05/2011   Procedure: CESAREAN SECTION;  Surgeon: Purcell Nails, MD;  Location: [facility];  Service: Gynecology;  Laterality: N/A;  Primary   NO PAST SURGERIES      Family History  Problem Relation Age of Onset   Hypertension Mother    Hyperlipidemia Mother    Arthritis Mother    Heart attack Mother    Hyperlipidemia Brother    Hypertension Brother    Nephrolithiasis Brother    Heart disease Maternal Uncle        heart surgery   Tuberculosis Maternal Grandmother     Social History   Socioeconomic History   Marital status: Married    Spouse name: John   Number of children: 1   Years of education: Nature conservation officer education level: Not on file  Occupational History    Employer: OTHER    Comment: Health visitor  Tobacco Use   Smoking status: Never   Smokeless tobacco: Never  Vaping Use   Vaping status: Never Used  Substance and Sexual Activity   Alcohol use: Yes    Comment: occasionally   Drug use:  Never   Sexual activity: Yes    Birth control/protection: Pill  Other Topics Concern   Not on file  Social History Narrative   Patient lives at home with family.   Caffeine Use: 1 cup of tea (5-7cups weekly)   Social Determinants of Health   Financial Resource Strain: Low Risk  (06/04/2021)   Received from [facility] prior to 03/21[facility]2022[facility]   Overall Financial Resource Strain (CARDIA)    Difficulty of Paying Living Expenses: Not hard at all  Food Insecurity: No Food Insecurity (06/04/2021)   Received from [facility] visits prior to 03/21/2022., [facility]   Hunger Vital Sign    Worried About Running Out of Food in the Last Year: Never true    Ran Out of Food in the Last Year:  Never true  Transportation Needs: No Transportation Needs (06/04/2021)   Received from [facility] visits prior to 03/21/2022., [facility]   PRAPARE - Transportation    Lack of Transportation (Medical): No    Lack of Transportation (Non-Medical): No  Physical Activity: Insufficiently Active (06/04/2021)   [facility] visits prior to 03/21/2022[facility]   Exercise Vital Sign    Days of Exercise per Week: 2 days    Minutes of Exercise per Session: 30 min  Stress: No Stress Concern Present (06/04/2021)   [facility] visits prior to 03/21/2022[facility]   [employer] of Occupational Health - Occupational Stress Questionnaire    Feeling of Stress : Only a little  Social Connections: Moderately Isolated (06/04/2021)   [facility] prior to [facility]21/20[facility]   Social Connection and Isolation Panel [NHANES]    Frequency of Communication with Friends and Family: More than three times a week    Frequency of Social Gatherings with Friends and Family: Twice a week    Attends Religious Services: Never    Database administrator or Organizations: No    Attends Banker Meetings: Never    Marital Status: Married  Catering manager Violence: Not At Risk (06/04/2021)   [facility] visits prior to 03/21/2022.   Humiliation, Afraid, Rape, and Kick questionnaire    Fear of Current or Ex-Partner: No    Emotionally Abused: No    Physically Abused: No    Sexually Abused: No    Review of Systems  All other systems reviewed and are negative.       Objective    BP 132/80 (BP Location: Left Arm, Patient Position: Sitting, Cuff Size: Large)   Pulse 70   Temp 98.2 F (36.8 C) (Oral)   Ht 5' 2.5" (1.588 m)   Wt 196 lb 8 oz (89.1 kg)   LMP 09/20/2022 (Exact Date)   SpO2 98%   BMI 35.37 kg/m    Physical Exam Vitals reviewed.  Constitutional:      Appearance: Normal appearance. She is well-groomed. She is obese.  Eyes:     Conjunctiva/sclera: Conjunctivae normal.  Neck:     Thyroid: No thyromegaly.  Cardiovascular:     Rate and Rhythm: Normal rate and regular rhythm.     Pulses: Normal pulses.     Heart sounds: S1 normal and S2 normal.  Pulmonary:     Effort: Pulmonary effort is normal.     Breath sounds: Normal breath sounds and  air entry.  Abdominal:     General: Bowel sounds are normal.  Musculoskeletal:     Right lower leg: No edema.     Left lower leg: No edema.  Neurological:     Mental Status: She is alert and oriented to person, place, and time. Mental status is at baseline.     Gait: Gait is intact.  Psychiatric:        Mood and Affect: Mood and affect normal.        Speech: Speech normal.        Behavior: Behavior normal.        Judgment: Judgment normal.         Assessment & Plan:  Need for immunization against influenza -     Flu vaccine trivalent PF, 6mos and older(Flulaval,Afluria,Fluarix,Fluzone)  Class 2 severe obesity due to excess calories with serious comorbidity and body mass index (BMI) of 35.0 to 35.9 in adult [facility]) Assessment & Plan: I have had an extensive 30 minute conversation today with the patient about healthy eating habits, exercise, calorie and carb goals for sustainable and successful weight loss. I gave the patient caloric and protein daily intake values as well as described the importance of increasing fiber and water intake. I discussed weight loss medications that could be used in the treatment of this patient. Handouts on low carb eating were given to the patient.    Total calories: 1600- 1800 per day  Total Protein per day: 95 grams per day  Total carbs: less than 100 grams per day  Co morbid condition is hyperlipidemia, failed diet and exercise multiple times over the last few years  Orders: -     Comprehensive  metabolic panel -     Hemoglobin A1c -     TSH -     Zepbound; Inject 2.5 mg into the skin once a week.  Dispense: 2 mL; Refill: 0 -     Zepbound; Inject 5 mg into the skin once a week.  Dispense: 2 mL; Refill: 0 -     Zepbound; Inject 7.5 mg into the skin once a week.  Dispense: 2 mL; Refill: 0  Mixed hyperlipidemia Assessment & Plan: Last LDL was >200, will check new lipid panel today. Am starting pt on Zepbound for weight loss which should improve her cholesterol as well. RTC in 3 months   Orders: -     Lipid panel  Lumbosacral disc herniation Assessment & Plan: Reviewed MRI findings, neurosurgeon's notes, will continue to monitor and treat flare ups as they arise.      Return in about 3 months (around 01/12/2023) for weight loss.   Karie Georges, MD

## 2022-10-13 NOTE — Patient Instructions (Addendum)
Total calories: 1600- 1800 per day  Total Protein per day: 95 grams per day  Total carbs: less than 100 grams per day  Add strength training -- 15-20 minutes per day

## 2022-10-13 NOTE — Assessment & Plan Note (Signed)
Reviewed MRI findings, neurosurgeon's notes, will continue to monitor and treat flare ups as they arise.

## 2022-10-15 ENCOUNTER — Encounter: Payer: Self-pay | Admitting: Family Medicine

## 2022-10-15 DIAGNOSIS — E782 Mixed hyperlipidemia: Secondary | ICD-10-CM

## 2022-10-15 MED ORDER — ROSUVASTATIN CALCIUM 5 MG PO TABS: 5.0000 mg | ORAL_TABLET | Freq: Every day | ORAL | 0 refills | Status: DC

## 2022-10-19 ENCOUNTER — Encounter: Payer: Self-pay | Admitting: Family Medicine

## 2022-10-21 NOTE — Telephone Encounter (Signed)
Message sent to PCP as I spoke with Tania at [facility] to see if a prior authorization is needed and she stated the Rx is not covered.

## 2022-10-22 MED ORDER — SAXENDA 18 MG/3ML ~~LOC~~ SOPN: PEN_INJECTOR | SUBCUTANEOUS | 5 refills | Status: DC

## 2022-10-23 MED ORDER — PHENTERMINE HCL 15 MG PO CAPS
15.0000 mg | ORAL_CAPSULE | ORAL | 0 refills | Status: DC
Start: 2022-10-23 — End: 2022-11-18

## 2022-11-05 ENCOUNTER — Encounter: Payer: Self-pay | Admitting: Family Medicine

## 2022-11-17 ENCOUNTER — Encounter: Payer: Self-pay | Admitting: Family Medicine

## 2022-11-18 MED ORDER — PHENTERMINE HCL 15 MG PO CAPS
15.0000 mg | ORAL_CAPSULE | ORAL | 0 refills | Status: DC
Start: 2022-11-18 — End: 2023-01-12

## 2022-12-14 ENCOUNTER — Encounter: Payer: Self-pay | Admitting: Family Medicine

## 2023-01-12 ENCOUNTER — Other Ambulatory Visit: Payer: Self-pay | Admitting: Family Medicine

## 2023-01-12 ENCOUNTER — Ambulatory Visit: Payer: BC Managed Care – PPO | Admitting: Family Medicine

## 2023-01-12 ENCOUNTER — Encounter: Payer: Self-pay | Admitting: Family Medicine

## 2023-01-12 VITALS — BP 122/84 | HR 75 | Temp 97.9°F | Ht 62.5 in | Wt 193.0 lb

## 2023-01-12 DIAGNOSIS — E782 Mixed hyperlipidemia: Secondary | ICD-10-CM

## 2023-01-12 LAB — COMPREHENSIVE METABOLIC PANEL
ALT: 16 U/L (ref 0–35)
AST: 16 U/L (ref 0–37)
Albumin: 4.3 g/dL (ref 3.5–5.2)
Alkaline Phosphatase: 52 U/L (ref 39–117)
BUN: 17 mg/dL (ref 6–23)
CO2: 23 meq/L (ref 19–32)
Calcium: 9.4 mg/dL (ref 8.4–10.5)
Chloride: 106 meq/L (ref 96–112)
Creatinine, Ser: 0.73 mg/dL (ref 0.40–1.20)
GFR: 99.76 mL/min (ref >60.00)
Glucose, Bld: 98 mg/dL (ref 70–99)
Potassium: 4.2 meq/L (ref 3.5–5.1)
Sodium: 138 meq/L (ref 135–145)
Total Bilirubin: 0.4 mg/dL (ref 0.2–1.2)
Total Protein: 7.5 g/dL (ref 6.0–8.3)

## 2023-01-12 LAB — LIPID PANEL
Cholesterol: 171 mg/dL (ref 0–200)
HDL: 43.9 mg/dL (ref >39.00)
LDL Cholesterol: 112 mg/dL — ABNORMAL HIGH (ref 0–99)
NonHDL: 127.3
Total CHOL/HDL Ratio: 4
Triglycerides: 79 mg/dL (ref 0.0–149.0)
VLDL: 15.8 mg/dL (ref 0.0–40.0)

## 2023-01-12 NOTE — Progress Notes (Signed)
   Established Patient Office Visit  Subjective   Patient ID: Aimee Lynch, female    DOB: [birth year]  Age: 42 y.o. MRN: [id number]  Chief Complaint  Patient presents with   Medical Management of Chronic Issues    Pt is here for follow up on her cholesterol. She reports she is taking the 5 mg of crestor daily-- she reports a mild amount of constipation but no other side effects to the medication. She needs labs today to follow up .   Pt just started the compounded semaglutide and she reports she is tolerating it well, states she can definitely feel that her appetite is decreased and not feeling as hungry, not as much "food noise".     Current Outpatient Medications  Medication Instructions   ibuprofen (ADVIL) 200 mg, Every 6 hours PRN   levonorgestrel-ethinyl estradiol (SEASONALE) 0.15-0.03 MG tablet 1 tablet, Daily   Multiple Vitamins-Minerals (MULTIVITAMIN ADULTS PO) Daily   naproxen sodium (ALEVE) 220 mg, As needed   Omega-3 Fatty Acids (OMEGA 3 PO) 1,250 mg, Daily   OVER THE COUNTER MEDICATION D-mannase with cranberry and dandelion-once a day   Probiotic Product (PROBIOTIC PO) Daily   rosuvastatin (CRESTOR) 5 mg, Oral, Daily    Patient Active Problem List   Diagnosis Date Noted   Class 2 severe obesity due to excess calories with serious comorbidity and body mass index (BMI) of 35.0 to 35.9 in adult [facility]) 10/13/2022   Lumbosacral disc herniation 10/13/2022   Mixed hyperlipidemia 10/13/2022   PROM (premature rupture of membranes) 02/24/2011   Abnormal ultrasonic finding on antenatal screening of mother 02/24/2011   Anemia 02/24/2011      Review of Systems  All other systems reviewed and are negative.     Objective:     BP 122/84   Pulse 75   Temp 97.9 F (36.6 C) (Oral)   Ht 5' 2.5" (1.588 m)   Wt 193 lb (87.5 kg)   LMP  (Exact Date)   SpO2 98%   BMI 34.74 kg/m    Physical Exam Vitals reviewed.  Constitutional:      Appearance: Normal appearance. She is  well-groomed. She is obese.  Cardiovascular:     Rate and Rhythm: Normal rate and regular rhythm.     Pulses: Normal pulses.     Heart sounds: S1 normal and S2 normal.  Pulmonary:     Effort: Pulmonary effort is normal.     Breath sounds: Normal breath sounds and air entry.  Neurological:     Mental Status: She is alert and oriented to person, place, and time. Mental status is at baseline.     Gait: Gait is intact.  Psychiatric:        Mood and Affect: Mood and affect normal.        Speech: Speech normal.        Behavior: Behavior normal.       The 10-year ASCVD risk score (Arnett DK, et al., 2019) is: 0.8%    Assessment & Plan:  Mixed hyperlipidemia Assessment & Plan: Doing well on the statin, no side effects reported, will check new lipid panel and LFT's today for monitoring. Continue rosuvastatin 5 mg daily  Orders: -     Lipid panel -     Comprehensive metabolic panel     Return in about 6 months (around 07/13/2023) for weight loss.    Karie Georges, MD

## 2023-01-18 NOTE — Assessment & Plan Note (Signed)
Doing well on the statin, no side effects reported, will check new lipid panel and LFT's today for monitoring. Continue rosuvastatin 5 mg daily

## 2023-04-11 ENCOUNTER — Other Ambulatory Visit: Payer: Self-pay | Admitting: Family Medicine

## 2023-04-11 DIAGNOSIS — E782 Mixed hyperlipidemia: Secondary | ICD-10-CM

## 2023-06-16 ENCOUNTER — Encounter: Payer: Self-pay | Admitting: Family Medicine

## 2023-07-07 DIAGNOSIS — Z1231 Encounter for screening mammogram for malignant neoplasm of breast: Secondary | ICD-10-CM | POA: Diagnosis not present

## 2023-07-07 LAB — HM MAMMOGRAPHY

## 2023-07-08 ENCOUNTER — Ambulatory Visit: Payer: Self-pay | Admitting: Family Medicine

## 2023-07-08 ENCOUNTER — Encounter: Payer: Self-pay | Admitting: Family Medicine

## 2023-07-09 ENCOUNTER — Other Ambulatory Visit: Payer: Self-pay | Admitting: Family Medicine

## 2023-07-09 DIAGNOSIS — E782 Mixed hyperlipidemia: Secondary | ICD-10-CM

## 2023-07-12 ENCOUNTER — Ambulatory Visit (INDEPENDENT_AMBULATORY_CARE_PROVIDER_SITE_OTHER): Admitting: Family Medicine

## 2023-07-12 ENCOUNTER — Encounter: Payer: Self-pay | Admitting: Family Medicine

## 2023-07-12 VITALS — BP 144/90 | HR 85 | Temp 98.2°F | Ht 62.5 in | Wt 175.2 lb

## 2023-07-12 DIAGNOSIS — E66812 Obesity, class 2: Secondary | ICD-10-CM

## 2023-07-12 DIAGNOSIS — Z1211 Encounter for screening for malignant neoplasm of colon: Secondary | ICD-10-CM

## 2023-07-12 DIAGNOSIS — R030 Elevated blood-pressure reading, without diagnosis of hypertension: Secondary | ICD-10-CM | POA: Diagnosis not present

## 2023-07-12 DIAGNOSIS — Z6835 Body mass index (BMI) 35.0-35.9, adult: Secondary | ICD-10-CM

## 2023-07-12 NOTE — Progress Notes (Unsigned)
   Established Patient Office Visit  Subjective   Patient ID: Aimee Lynch, female    DOB: 1980/01/29  Age: 43 y.o. MRN: [id number]  Chief Complaint  Patient presents with  . Medical Management of Chronic Issues    Pt is here for follow up. States that she is using a Set designer for her semaglutide prescription since her insurance company is not covering weight loss medication. States that she is doing well on the medication so far. No side effects reported, no nausea, maybe some mild constipation but she is taking fiber capsules and she is going regularly. Is using [facility] for her medication.    Reviewed health maintenance measures-- pt opted for cologuard testing after counseling on this topic. Pap smear      Current Outpatient Medications  Medication Instructions  . ibuprofen  (ADVIL ) 200 mg, Every 6 hours PRN  . levonorgestrel-ethinyl estradiol (SEASONALE) 0.15-0.03 MG tablet 1 tablet, Daily  . Multiple Vitamins-Minerals (MULTIVITAMIN ADULTS PO) Daily  . naproxen sodium (ALEVE) 220 mg, As needed  . Omega-3 Fatty Acids (OMEGA 3 PO) 1,250 mg, Daily  . OVER THE COUNTER MEDICATION D-mannase with cranberry and dandelion-once a day  . Probiotic Product (PROBIOTIC PO) Daily  . rosuvastatin  (CRESTOR ) 5 mg, Oral, Daily    {History (Optional):23778}  ROS    Objective:     BP (!) 144/90   Pulse 85   Temp 98.2 F (36.8 C) (Oral)   Ht 5' 2.5 (1.588 m)   Wt 175 lb 3.2 oz (79.5 kg)   SpO2 98%   BMI 31.53 kg/m  {Vitals History (Optional):23777}  Physical Exam Constitutional:      Appearance: Normal appearance. She is obese.   Neurological:     Mental Status: She is alert.     No results found for any visits on 07/12/23.  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    Heron CHRISTELLA Sharper, MD

## 2023-07-12 NOTE — Patient Instructions (Signed)
 Check blood pressure for 2 weeks daily -- send them to me to let me know what you are getting at home.

## 2023-07-13 ENCOUNTER — Ambulatory Visit: Payer: BC Managed Care – PPO | Admitting: Family Medicine

## 2023-07-14 NOTE — Assessment & Plan Note (Addendum)
 Pt is doing well on the compounded semaglutide, has lost 18 pounds since her visit in December 2024. Will continue to monitor her weight at each visit.  I provided ongoing dietary and exercise counseling for the patient today for 20 minutes.

## 2023-08-02 DIAGNOSIS — Z1211 Encounter for screening for malignant neoplasm of colon: Secondary | ICD-10-CM | POA: Diagnosis not present

## 2023-08-13 LAB — COLOGUARD: COLOGUARD: NEGATIVE

## 2023-08-16 ENCOUNTER — Ambulatory Visit: Payer: Self-pay | Admitting: Family Medicine

## 2023-08-25 ENCOUNTER — Other Ambulatory Visit: Payer: Self-pay | Admitting: Medical Genetics

## 2023-08-25 ENCOUNTER — Encounter: Payer: Self-pay | Admitting: Family Medicine

## 2023-10-05 ENCOUNTER — Other Ambulatory Visit: Payer: Self-pay | Admitting: Family Medicine

## 2023-10-05 DIAGNOSIS — E782 Mixed hyperlipidemia: Secondary | ICD-10-CM

## 2023-11-02 ENCOUNTER — Encounter: Payer: Self-pay | Admitting: Family Medicine

## 2023-11-02 ENCOUNTER — Ambulatory Visit: Admitting: Family Medicine

## 2023-11-02 ENCOUNTER — Other Ambulatory Visit (HOSPITAL_COMMUNITY)
Admission: RE | Admit: 2023-11-02 | Discharge: 2023-11-02 | Disposition: A | Discharge status: Home / Self Care | Source: Ambulatory Visit | Attending: *Deleted | Admitting: *Deleted

## 2023-11-02 ENCOUNTER — Ambulatory Visit: Payer: Self-pay | Admitting: Family Medicine

## 2023-11-02 VITALS — BP 110/82 | HR 81 | Temp 98.2°F | Ht 62.25 in | Wt 165.2 lb

## 2023-11-02 DIAGNOSIS — Z124 Encounter for screening for malignant neoplasm of cervix: Secondary | ICD-10-CM

## 2023-11-02 DIAGNOSIS — Z0000 Encounter for general adult medical examination without abnormal findings: Secondary | ICD-10-CM

## 2023-11-02 DIAGNOSIS — E782 Mixed hyperlipidemia: Secondary | ICD-10-CM

## 2023-11-02 DIAGNOSIS — Z113 Encounter for screening for infections with a predominantly sexual mode of transmission: Secondary | ICD-10-CM | POA: Insufficient documentation

## 2023-11-02 DIAGNOSIS — Z23 Encounter for immunization: Secondary | ICD-10-CM

## 2023-11-02 LAB — COMPREHENSIVE METABOLIC PANEL WITH GFR
ALT: 15 U/L (ref 0–35)
AST: 15 U/L (ref 0–37)
Albumin: 4.4 g/dL (ref 3.5–5.2)
Alkaline Phosphatase: 41 U/L (ref 39–117)
BUN: 13 mg/dL (ref 6–23)
CO2: 23 meq/L (ref 19–32)
Calcium: 9.1 mg/dL (ref 8.4–10.5)
Chloride: 107 meq/L (ref 96–112)
Creatinine, Ser: 0.74 mg/dL (ref 0.40–1.20)
GFR: 97.59 mL/min (ref >60.00)
Glucose, Bld: 93 mg/dL (ref 70–99)
Potassium: 3.9 meq/L (ref 3.5–5.1)
Sodium: 138 meq/L (ref 135–145)
Total Bilirubin: 0.4 mg/dL (ref 0.2–1.2)
Total Protein: 7.4 g/dL (ref 6.0–8.3)

## 2023-11-02 LAB — LIPID PANEL
Cholesterol: 182 mg/dL (ref 0–200)
HDL: 41.5 mg/dL (ref >39.00)
LDL Cholesterol: 124 mg/dL — ABNORMAL HIGH (ref 0–99)
NonHDL: 140.93
Total CHOL/HDL Ratio: 4
Triglycerides: 85 mg/dL (ref 0.0–149.0)
VLDL: 17 mg/dL (ref 0.0–40.0)

## 2023-11-02 NOTE — Progress Notes (Unsigned)
 Complete physical exam  Patient: Aimee Lynch   DOB: 1980/05/17   43 y.o. Female  MRN: [id number]  Subjective:    Chief Complaint  Patient presents with  . Annual Exam    Deari Lynch is a 43 y.o. female who presents today for a complete physical exam. She reports consuming a general diet. Staying low carb, eating veggies, high protein.Gym/ health club routine includes light weights and twice a week. She generally feels well. She reports sleeping fairly well. She does not have additional problems to discuss today.    Most recent fall risk assessment:     No data to display           Most recent depression screenings:    11/02/2023    8:05 AM 10/13/2022    8:32 AM  PHQ 2/9 Scores  PHQ - 2 Score 0 0  PHQ- 9 Score 0 0    Vision:Within last year and Dental: No current dental problems and Receives regular dental care  Patient Active Problem List   Diagnosis Date Noted  . Class 2 severe obesity due to excess calories with serious comorbidity and body mass index (BMI) of 35.0 to 35.9 in adult 10/13/2022  . Lumbosacral disc herniation 10/13/2022  . Mixed hyperlipidemia 10/13/2022  . PROM (premature rupture of membranes) 02/24/2011  . Abnormal ultrasonic finding on antenatal screening of mother 02/24/2011  . Anemia 02/24/2011      Patient Care Team: Aimee Heron HERO, MD as PCP - General (Family Medicine)   Outpatient Medications Prior to Visit  Medication Sig  . ibuprofen  (ADVIL ,MOTRIN ) 200 MG tablet Take 200 mg by mouth every 6 (six) hours as needed.  SABRA levonorgestrel-ethinyl estradiol (SEASONALE) 0.15-0.03 MG tablet Take 1 tablet by mouth daily.  . Multiple Vitamins-Minerals (MULTIVITAMIN ADULTS PO) Take by mouth daily.  . naproxen sodium (ALEVE) 220 MG tablet Take 220 mg by mouth as needed.  . Omega-3 Fatty Acids (OMEGA 3 PO) Take 1,250 mg by mouth daily.  SABRA OVER THE COUNTER MEDICATION D-mannase with cranberry and dandelion-once a day  . Probiotic Product (PROBIOTIC  PO) Take by mouth daily.  . rosuvastatin  (CRESTOR ) 5 MG tablet TAKE 1 TABLET (5 MG TOTAL) BY MOUTH DAILY.   No facility-administered medications prior to visit.    ROS     Objective:     BP 110/82   Pulse 81   Temp 98.2 F (36.8 C) (Oral)   Ht 5' 2.25 (1.581 m)   Wt 165 lb 3.2 oz (74.9 kg)   SpO2 97%   BMI 29.97 kg/m  {Vitals History (Optional):23777}  Physical Exam Vitals reviewed. Exam conducted with a chaperone present.  Constitutional:      Appearance: She is normal weight.  Cardiovascular:     Rate and Rhythm: Normal rate and regular rhythm.     Pulses: Normal pulses.     Heart sounds: Normal heart sounds.  Pulmonary:     Effort: Pulmonary effort is normal.     Breath sounds: Normal breath sounds.  Chest:     Chest wall: No mass.  Breasts:    Tanner Score is 5.     Right: Normal. No mass or tenderness.     Left: Normal. No mass or tenderness.  Abdominal:     General: Abdomen is flat. Bowel sounds are normal.     Palpations: Abdomen is soft.  Genitourinary:    General: Normal vulva.     Exam position: Lithotomy position.  Tanner stage (genital): 5.     Vagina: Normal.     Cervix: Normal.     Uterus: Normal.      Adnexa: Right adnexa normal and left adnexa normal.     Rectum: Normal.  Lymphadenopathy:     Upper Body:     Right upper body: No axillary adenopathy.     Left upper body: No axillary adenopathy.  Neurological:     General: No focal deficit present.     Mental Status: She is alert and oriented to person, place, and time.  Psychiatric:        Mood and Affect: Mood and affect normal.      No results found for any visits on 11/02/23. {Show previous labs (optional):23779}    Assessment & Plan:    Routine Health Maintenance and Physical Exam  Immunization History  Administered Date(s) Administered  . Influenza, Seasonal, Injecte, Preservative Fre 10/13/2022  . Influenza,inj,Quad PF,6+ Mos 09/29/2016, 09/03/2018  .  Influenza-Unspecified 01/18/2018  . Moderna Sars-Covid-2 Vaccination 01/18/2020  . PFIZER Comirnaty(Gray Top)Covid-19 Tri-Sucrose Vaccine 04/14/2019, 05/10/2019    Health Maintenance  Topic Date Due  . DTaP/Tdap/Td (1 - Tdap) Never done  . Hepatitis B Vaccines 19-59 Average Risk (1 of 3 - 19+ 3-dose series) Never done  . HPV VACCINES (1 - 3-dose SCDM series) Never done  . Cervical Cancer Screening (HPV/Pap Cotest)  Never done  . COVID-19 Vaccine (4 - 2025-26 season) 09/20/2023  . Influenza Vaccine  04/18/2024 (Originally 08/20/2023)  . Hepatitis C Screening  11/01/2024 (Originally 03/28/1996)  . Mammogram  07/06/2025  . Fecal DNA (Cologuard)  08/02/2026  . HIV Screening  Completed  . Pneumococcal Vaccine  Aged Out  . Meningococcal B Vaccine  Aged Out  . Colonoscopy  Discontinued    Discussed health benefits of physical activity, and encouraged her to engage in regular exercise appropriate for her age and condition.  Immunization due -     Tdap vaccine greater than or equal to 7yo IM  Mixed hyperlipidemia -     Comprehensive metabolic panel with GFR; Future -     Lipid panel; Future  Encounter for Papanicolaou smear for cervical cancer screening -     Cytology - PAP  Routine adult health maintenance  General physical exam findings are normal today. I reviewed the patient's preventative testing, immunizations, and lifestyle habits. I made appropriate recommendations and placed orders for the appropriate tests and/or vaccinations. I counseled the patient on the CDC's recommendations for healthy exercise and diet. I counseled the patient on healthy sleep habits and stress management. Handouts to reinforce the counseling were given at the conclusion of the visit.    Return in about 1 year (around 11/01/2024) for annual physical exam.     Heron Aimee Sharper, MD

## 2023-11-03 LAB — CYTOLOGY - PAP
Chlamydia: NEGATIVE
Comment: NEGATIVE
Comment: NEGATIVE
Comment: NEGATIVE
Comment: NORMAL
Diagnosis: NEGATIVE
High risk HPV: NEGATIVE
Neisseria Gonorrhea: NEGATIVE
Trichomonas: NEGATIVE

## 2023-11-24 ENCOUNTER — Other Ambulatory Visit: Payer: Self-pay | Admitting: Medical Genetics

## 2023-11-24 DIAGNOSIS — Z006 Encounter for examination for normal comparison and control in clinical research program: Secondary | ICD-10-CM

## 2023-12-20 LAB — GENECONNECT MOLECULAR SCREEN: Genetic Analysis Overall Interpretation: NEGATIVE

## 2023-12-27 ENCOUNTER — Other Ambulatory Visit: Payer: Self-pay | Admitting: Family Medicine

## 2023-12-27 DIAGNOSIS — E782 Mixed hyperlipidemia: Secondary | ICD-10-CM
